# Patient Record
Sex: Female | Born: 1955 | Hispanic: Refuse to answer | Marital: Married | Smoking: Never smoker
Health system: Southern US, Community
[De-identification: ages and names within clinical notes are randomized; demographics above are authoritative.]

## PROBLEM LIST (undated history)

## (undated) DIAGNOSIS — E785 Hyperlipidemia, unspecified: Secondary | ICD-10-CM

## (undated) DIAGNOSIS — R011 Cardiac murmur, unspecified: Secondary | ICD-10-CM

## (undated) DIAGNOSIS — M1990 Unspecified osteoarthritis, unspecified site: Secondary | ICD-10-CM

## (undated) DIAGNOSIS — H269 Unspecified cataract: Secondary | ICD-10-CM

## (undated) DIAGNOSIS — I10 Essential (primary) hypertension: Secondary | ICD-10-CM

## (undated) DIAGNOSIS — I82409 Acute embolism and thrombosis of unspecified deep veins of unspecified lower extremity: Secondary | ICD-10-CM

## (undated) DIAGNOSIS — K5730 Diverticulosis of large intestine without perforation or abscess without bleeding: Secondary | ICD-10-CM

## (undated) DIAGNOSIS — Z8619 Personal history of other infectious and parasitic diseases: Secondary | ICD-10-CM

## (undated) DIAGNOSIS — D649 Anemia, unspecified: Secondary | ICD-10-CM

## (undated) DIAGNOSIS — I341 Nonrheumatic mitral (valve) prolapse: Secondary | ICD-10-CM

## (undated) DIAGNOSIS — N390 Urinary tract infection, site not specified: Secondary | ICD-10-CM

## (undated) DIAGNOSIS — K635 Polyp of colon: Secondary | ICD-10-CM

## (undated) DIAGNOSIS — I499 Cardiac arrhythmia, unspecified: Secondary | ICD-10-CM

## (undated) HISTORY — DX: Anemia, unspecified: D64.9

## (undated) HISTORY — DX: Nonrheumatic mitral (valve) prolapse: I34.1

## (undated) HISTORY — DX: Cardiac murmur, unspecified: R01.1

## (undated) HISTORY — DX: Personal history of other infectious and parasitic diseases: Z86.19

## (undated) HISTORY — DX: Polyp of colon: K63.5

## (undated) HISTORY — PX: OOPHORECTOMY: SHX86

## (undated) HISTORY — DX: Cardiac arrhythmia, unspecified: I49.9

## (undated) HISTORY — DX: Urinary tract infection, site not specified: N39.0

## (undated) HISTORY — DX: Unspecified osteoarthritis, unspecified site: M19.90

## (undated) HISTORY — DX: Essential (primary) hypertension: I10

## (undated) HISTORY — DX: Hyperlipidemia, unspecified: E78.5

## (undated) HISTORY — DX: Acute embolism and thrombosis of unspecified deep veins of unspecified lower extremity: I82.409

## (undated) HISTORY — DX: Diverticulosis of large intestine without perforation or abscess without bleeding: K57.30

## (undated) HISTORY — DX: Unspecified cataract: H26.9

---

## 1974-06-12 HISTORY — PX: FINGER SURGERY: SHX640

## 1977-06-12 HISTORY — PX: TONSILLECTOMY AND ADENOIDECTOMY: SUR1326

## 1984-06-12 HISTORY — PX: PARTIAL HYSTERECTOMY: SHX80

## 1995-06-13 HISTORY — PX: CHOLECYSTECTOMY: SHX55

## 1995-06-13 HISTORY — PX: APPENDECTOMY: SHX54

## 2001-06-12 HISTORY — PX: BLADDER TUMOR EXCISION: SHX238

## 2003-06-13 HISTORY — PX: BACK SURGERY: SHX140

## 2007-06-13 DIAGNOSIS — I82409 Acute embolism and thrombosis of unspecified deep veins of unspecified lower extremity: Secondary | ICD-10-CM

## 2007-06-13 HISTORY — PX: BUNIONECTOMY: SHX129

## 2007-06-13 HISTORY — DX: Acute embolism and thrombosis of unspecified deep veins of unspecified lower extremity: I82.409

## 2010-11-24 ENCOUNTER — Encounter: Payer: Self-pay | Admitting: Family Medicine

## 2010-11-24 ENCOUNTER — Ambulatory Visit (INDEPENDENT_AMBULATORY_CARE_PROVIDER_SITE_OTHER): Payer: BC Managed Care – PPO | Admitting: Family Medicine

## 2010-11-24 DIAGNOSIS — Z8672 Personal history of thrombophlebitis: Secondary | ICD-10-CM

## 2010-11-24 DIAGNOSIS — E785 Hyperlipidemia, unspecified: Secondary | ICD-10-CM

## 2010-11-24 DIAGNOSIS — Z86718 Personal history of other venous thrombosis and embolism: Secondary | ICD-10-CM

## 2010-11-24 DIAGNOSIS — I10 Essential (primary) hypertension: Secondary | ICD-10-CM

## 2010-11-24 DIAGNOSIS — J310 Chronic rhinitis: Secondary | ICD-10-CM

## 2010-11-24 LAB — LIPID PANEL
Total CHOL/HDL Ratio: 4
VLDL: 27.4 mg/dL (ref 0.0–40.0)

## 2010-11-24 LAB — CBC WITH DIFFERENTIAL/PLATELET
Basophils Absolute: 0 10*3/uL (ref 0.0–0.1)
Eosinophils Absolute: 0.2 10*3/uL (ref 0.0–0.7)
Hemoglobin: 12.6 g/dL (ref 12.0–15.0)
Lymphocytes Relative: 55.8 % — ABNORMAL HIGH (ref 12.0–46.0)
MCHC: 33.9 g/dL (ref 30.0–36.0)
Monocytes Relative: 5.7 % (ref 3.0–12.0)
Neutro Abs: 2.3 10*3/uL (ref 1.4–7.7)
Neutrophils Relative %: 35.8 % — ABNORMAL LOW (ref 43.0–77.0)
RBC: 4.21 Mil/uL (ref 3.87–5.11)
RDW: 14.7 % — ABNORMAL HIGH (ref 11.5–14.6)

## 2010-11-24 LAB — HEPATIC FUNCTION PANEL
ALT: 15 U/L (ref 0–35)
AST: 19 U/L (ref 0–37)
Alkaline Phosphatase: 72 U/L (ref 39–117)
Bilirubin, Direct: 0.1 mg/dL (ref 0.0–0.3)
Total Bilirubin: 0.6 mg/dL (ref 0.3–1.2)
Total Protein: 8 g/dL (ref 6.0–8.3)

## 2010-11-24 LAB — LDL CHOLESTEROL, DIRECT: Direct LDL: 172.5 mg/dL

## 2010-11-24 LAB — BASIC METABOLIC PANEL
CO2: 32 mEq/L (ref 19–32)
Chloride: 103 mEq/L (ref 96–112)
Potassium: 4 mEq/L (ref 3.5–5.1)

## 2010-11-24 NOTE — Patient Instructions (Signed)
Please schedule your complete physical at your convenience- you can eat before this appt since we'll have your labs Start OTC Claritin or Zyrtec daily for allergy symptoms Start a program like [employer] or [place] to help w/ weight loss We'll notify you of your lab results and adjust/start meds as needed STOP THE AMLODIPINE (NORVASC) Call with any questions or concerns Welcome!  We're glad to have you!

## 2010-11-24 NOTE — Progress Notes (Signed)
  Subjective:    Patient ID: Traci Vasquez, female    DOB: [birth year], 55 y.o.   MRN: [id number]  HPI Previous MD- Darlen Round, prior to that Dr Shaune Pollack at [facility].  Mammo is due in July, has had colonoscopy w/in 10 yrs.  Urologist- [facility].  HTN- chronic problem for pt, excellent control today.  Started Norvasc 2-3 yrs ago when pt was under a lot of stress.  No CP, SOB, HAs, visual changes, edema  URI- sxs started 1 month ago, has been on 2 different abx- is currently on 2nd med which was a 30 day script.  Cough persists.  Hx of asthma and PNA.  Has albuterol inhaler but this is expired.  Hyperlipidemia- chronic problem for pt.  Is supposed to be on Zocor but 'it makes me feel so bad'.  Reports med made nauseous and 'woozy'.  Has been off meds for over 2 yrs.  Would like to lose weight to improve #s.  Hx of DVT- occurred post op, 9/09.  Completed coumadin course.  Hasn't had any problems since.  Seasonal allergies- chronic problem for pt, not currently on meds.  Previously had allergy shots (25 yrs ago).  Will have difficulty w/ grass, mold/mildew, dust.     Review of Systems For ROS see HPI     Objective:   Physical Exam  Constitutional: She is oriented to person, place, and time. She appears well-developed and well-nourished. No distress.  HENT:  Head: Normocephalic and atraumatic.  Mouth/Throat: Oropharynx is clear and moist. No oropharyngeal exudate.       Marked turbinate edema + PND No TTP over sinuses  Eyes: Conjunctivae and EOM are normal. Pupils are equal, round, and reactive to light.  Neck: Normal range of motion. Neck supple. No thyromegaly present.  Cardiovascular: Normal rate, regular rhythm, normal heart sounds and intact distal pulses.   No murmur heard. Pulmonary/Chest: Effort normal and breath sounds normal. No respiratory distress.  Abdominal: Soft. She exhibits no distension. There is no tenderness.  Musculoskeletal: She exhibits no edema.    Lymphadenopathy:    She has no cervical adenopathy.  Neurological: She is alert and oriented to person, place, and time.  Skin: Skin is warm and dry.  Psychiatric: She has a normal mood and affect. Her behavior is normal.          Assessment & Plan:

## 2010-11-26 LAB — VITAMIN D 1,25 DIHYDROXY
Vitamin D 1, 25 (OH)2 Total: 46 pg/mL (ref 18–72)
Vitamin D2 1, 25 (OH)2: <8 pg/mL
Vitamin D3 1, 25 (OH)2: 46 pg/mL

## 2010-12-01 ENCOUNTER — Encounter: Payer: Self-pay | Admitting: *Deleted

## 2010-12-02 ENCOUNTER — Encounter: Payer: Self-pay | Admitting: *Deleted

## 2010-12-11 ENCOUNTER — Encounter: Payer: Self-pay | Admitting: Family Medicine

## 2010-12-11 NOTE — Assessment & Plan Note (Signed)
Pt's 1 month of URI sxs is currently not infectious at all but rather untreated allergies.  Start oral antihistamine daily.  Reviewed supportive care and red flags that should prompt return.  Pt expressed understanding and is in agreement w/ plan.

## 2010-12-11 NOTE — Assessment & Plan Note (Signed)
Excellent control.  Possibly over medicated.  Will stop Norvasc and monitor closely.

## 2010-12-11 NOTE — Assessment & Plan Note (Signed)
Chronic problem for pt but not currently on meds.  Will check labs.  Discussed importance of healthy diet and regular exercise for both weight loss and health benefits.

## 2010-12-11 NOTE — Assessment & Plan Note (Signed)
This occurred post op.  Completed course of coumadin at the time and has not had any problems since.  Will need to watch if future surgery is needed.

## 2011-01-04 ENCOUNTER — Ambulatory Visit (INDEPENDENT_AMBULATORY_CARE_PROVIDER_SITE_OTHER): Payer: BC Managed Care – PPO | Admitting: Family Medicine

## 2011-01-04 VITALS — BP 150/90 | Temp 98.9°F | Wt 216.6 lb

## 2011-01-04 DIAGNOSIS — R21 Rash and other nonspecific skin eruption: Secondary | ICD-10-CM | POA: Insufficient documentation

## 2011-01-04 MED ORDER — NYSTATIN-TRIAMCINOLONE 100000-0.1 UNIT/GM-% EX OINT: TOPICAL_OINTMENT | Freq: Two times a day (BID) | CUTANEOUS | Status: DC

## 2011-01-04 MED ORDER — METHYLPREDNISOLONE ACETATE 80 MG/ML IJ SUSP
80.0000 mg | Freq: Once | INTRAMUSCULAR | Status: AC
  Administered 2011-01-04: 80 mg via INTRAMUSCULAR

## 2011-01-04 MED ORDER — PREDNISONE 20 MG PO TABS: ORAL_TABLET | ORAL | Status: DC

## 2011-01-04 NOTE — Assessment & Plan Note (Signed)
Rash consistent w/ yeast.  Given severity and presence of excoriations will start steroids for relief.  Also start antifungal cream.  Reviewed supportive care and red flags that should prompt return.  Pt expressed understanding and is in agreement w/ plan.

## 2011-01-04 NOTE — Patient Instructions (Signed)
Follow up as scheduled Start the Prednisone tomorrow Apply the cream twice daily to help soothe and heal Call with any questions or concerns Hang in there!

## 2011-01-04 NOTE — Progress Notes (Signed)
  Subjective:    Patient ID: Traci Vasquez, female    DOB: 1955-09-03, 55 y.o.   MRN: [id number]  HPI Rash- sxs started over the weekend.  Irritation present in groin and under breasts, worse in groin.  Having burning and itching pain.  No fevers, blisters.  'i'm itching like crazy!  i know i should stop but i can't!'.  Hx of similar that usually requires steroid injection   Review of Systems For ROS see HPI     Objective:   Physical Exam  Constitutional: She appears well-developed and well-nourished. She appears distressed (obviously uncomfortable).  Skin: Rash (consistent w/ fungal infxn under breasts bilaterally and in groin creases) noted. There is erythema (also w/ excoriations in groin creases).          Assessment & Plan:

## 2011-01-23 ENCOUNTER — Encounter: Payer: Self-pay | Admitting: Family Medicine

## 2011-01-23 ENCOUNTER — Ambulatory Visit (INDEPENDENT_AMBULATORY_CARE_PROVIDER_SITE_OTHER): Payer: BC Managed Care – PPO | Admitting: Family Medicine

## 2011-01-23 DIAGNOSIS — Z1231 Encounter for screening mammogram for malignant neoplasm of breast: Secondary | ICD-10-CM

## 2011-01-23 DIAGNOSIS — E785 Hyperlipidemia, unspecified: Secondary | ICD-10-CM

## 2011-01-23 DIAGNOSIS — I10 Essential (primary) hypertension: Secondary | ICD-10-CM

## 2011-01-23 DIAGNOSIS — R209 Unspecified disturbances of skin sensation: Secondary | ICD-10-CM

## 2011-01-23 DIAGNOSIS — Z0000 Encounter for general adult medical examination without abnormal findings: Secondary | ICD-10-CM | POA: Insufficient documentation

## 2011-01-23 DIAGNOSIS — R200 Anesthesia of skin: Secondary | ICD-10-CM

## 2011-01-23 DIAGNOSIS — Z780 Asymptomatic menopausal state: Secondary | ICD-10-CM

## 2011-01-23 NOTE — Patient Instructions (Signed)
Schedule a nurse visit in 2-3 days to have TB test read Follow up in 6 months to recheck BP and cholesterol Someone will call you with your mammo/dexa and hand specialist appts We'll notify you of your lab results Try and make healthy food choices and get regular exercise Call with any questions or concerns Hang in there!

## 2011-01-23 NOTE — Progress Notes (Signed)
  Subjective:    Patient ID: Traci Vasquez, female    DOB: [birth year], 55 y.o.   MRN: [id number]  HPI CPE- s/p TAH-BSO, no need for paps or pelvic exams.  Due for mammo- plans to call and schedule.  UTD on colonoscopy- 2006 or 2007 (Done at [facility])  No concerns today.   Review of Systems Patient reports no vision/ hearing changes, adenopathy,fever, weight change,  persistant/recurrent hoarseness , swallowing issues, chest pain, palpitations, edema, persistant/recurrent cough, hemoptysis, dyspnea (rest/exertional/paroxysmal nocturnal), gastrointestinal bleeding (melena, rectal bleeding), abdominal pain, significant heartburn, bowel changes, GU symptoms (dysuria, hematuria, incontinence), Gyn symptoms (abnormal  bleeding, pain),  syncope, focal weakness, memory loss, skin/hair/nail changes, abnormal bruising or bleeding, anxiety, or depression.   + numbness/tingling of R hand- has been present for 15 yrs but recently progressing.  Will 'lock up'    Objective:   Physical Exam  General Appearance:    Alert, cooperative, no distress, appears stated age  Head:    Normocephalic, without obvious abnormality, atraumatic  Eyes:    PERRL, conjunctiva/corneas clear, EOM's intact, fundi    benign, both eyes  Ears:    Normal TM's and external ear canals, both ears  Nose:   Nares normal, septum midline, mucosa normal, no drainage    or sinus tenderness  Throat:   Lips, mucosa, and tongue normal; teeth and gums normal  Neck:   Supple, symmetrical, trachea midline, no adenopathy;    Thyroid: no enlargement/tenderness, small (~1cm) firm nodule superior to thyroid  Back:     Symmetric, no curvature, ROM normal, no CVA tenderness  Lungs:     Clear to auscultation bilaterally, respirations unlabored  Chest Wall:    No tenderness or deformity   Heart:    Regular rate and rhythm, S1 and S2 normal, no murmur, rub   or gallop  Breast Exam:    No tenderness, masses, or nipple abnormality  Abdomen:     Soft,  non-tender, bowel sounds active all four quadrants,    no masses, no organomegaly  Genitalia:    deferred  Rectal:    Extremities:   Extremities normal, atraumatic, no cyanosis or edema  Pulses:   2+ and symmetric all extremities  Skin:   Skin color, texture, turgor normal, no rashes or lesions  Lymph nodes:   Cervical, supraclavicular, and axillary nodes normal  Neurologic:   CNII-XII intact, normal strength, sensation and reflexes    throughout          Assessment & Plan:

## 2011-01-25 LAB — TB SKIN TEST
Induration: 0
TB Skin Test: NEGATIVE mm

## 2011-01-31 NOTE — Assessment & Plan Note (Signed)
Pt's PE WNL.  Due for mammo and dexa.  Will refer.  Check labs.  Anticipatory guidance provided.

## 2011-01-31 NOTE — Assessment & Plan Note (Signed)
At goal.  Tolerating meds.  No changes.

## 2011-01-31 NOTE — Assessment & Plan Note (Signed)
Hx of carpal tunnel.  Was told to return when sxs bothered her.  They are now bothersome.  Refer to hand surgeon for evaluation and tx.

## 2011-01-31 NOTE — Assessment & Plan Note (Signed)
Due for labs.  Adjust meds prn. 

## 2011-02-02 ENCOUNTER — Other Ambulatory Visit: Payer: Self-pay | Admitting: Family Medicine

## 2011-02-02 MED ORDER — HYDROCHLOROTHIAZIDE 25 MG PO TABS: 25.0000 mg | ORAL_TABLET | Freq: Every day | ORAL | Status: DC

## 2011-02-02 NOTE — Telephone Encounter (Signed)
Rx sent 

## 2011-03-16 ENCOUNTER — Encounter: Payer: Self-pay | Admitting: Family Medicine

## 2011-03-21 ENCOUNTER — Encounter: Payer: Self-pay | Admitting: Family Medicine

## 2011-06-20 ENCOUNTER — Ambulatory Visit: Payer: BC Managed Care – PPO | Admitting: Internal Medicine

## 2011-08-15 ENCOUNTER — Ambulatory Visit (INDEPENDENT_AMBULATORY_CARE_PROVIDER_SITE_OTHER): Payer: BC Managed Care – PPO | Admitting: Family Medicine

## 2011-08-15 ENCOUNTER — Encounter: Payer: Self-pay | Admitting: Family Medicine

## 2011-08-15 DIAGNOSIS — I951 Orthostatic hypotension: Secondary | ICD-10-CM

## 2011-08-15 DIAGNOSIS — K529 Noninfective gastroenteritis and colitis, unspecified: Secondary | ICD-10-CM

## 2011-08-15 DIAGNOSIS — K5289 Other specified noninfective gastroenteritis and colitis: Secondary | ICD-10-CM

## 2011-08-15 DIAGNOSIS — R42 Dizziness and giddiness: Secondary | ICD-10-CM

## 2011-08-15 DIAGNOSIS — R21 Rash and other nonspecific skin eruption: Secondary | ICD-10-CM

## 2011-08-15 DIAGNOSIS — R3129 Other microscopic hematuria: Secondary | ICD-10-CM | POA: Insufficient documentation

## 2011-08-15 LAB — POCT URINALYSIS DIPSTICK
Leukocytes, UA: NEGATIVE
Nitrite, UA: NEGATIVE
Protein, UA: NEGATIVE
Urobilinogen, UA: 0.2
pH, UA: 5

## 2011-08-15 MED ORDER — NYSTATIN-TRIAMCINOLONE 100000-0.1 UNIT/GM-% EX OINT: TOPICAL_OINTMENT | Freq: Two times a day (BID) | CUTANEOUS | Status: DC

## 2011-08-15 MED ORDER — CIPROFLOXACIN HCL 500 MG PO TABS: 500.0000 mg | ORAL_TABLET | Freq: Two times a day (BID) | ORAL | Status: AC

## 2011-08-15 NOTE — Assessment & Plan Note (Signed)
New.  Reviewed ER record from [facility].  Pt w/ normal CBC, normal abd CT.  Continue Zofran.  Push fluids.

## 2011-08-15 NOTE — Assessment & Plan Note (Signed)
New.  Pt orthostatic today.  Likely the cause of her positional dizziness.  Encouraged increased fluids, changing positions slowly.  Reviewed supportive care and red flags that should prompt return.  Pt expressed understanding and is in agreement w/ plan.

## 2011-08-15 NOTE — Assessment & Plan Note (Signed)
Refill on cream provided.

## 2011-08-15 NOTE — Patient Instructions (Signed)
You are still recovering from your viral illness DRINK LOTS OF FLUIDS!!! Your dizziness is due to dehydration Start the Cipro for presumed bladder infection Call with any questions or concerns Hang in there!

## 2011-08-15 NOTE — Progress Notes (Signed)
  Subjective:    Patient ID: Traci Vasquez, female    DOB: January 29, 1956, 56 y.o.   MRN: [id number]  HPI ER f/u- went to ER on Sunday w/ abdominal cramping, diarrhea, nausea, HA.  Was given IVF, Zofran.  CBC unremarkable.  Did have abnormal UA w/out cx.  Will need to be repeated.  Still having abd cramping but diarrhea has improved.  Still having 'really bad HA'.  Reports 'stumbling' when getting up.  Will only have 'momentary' dizziness and this only occurs w/ position change.  + nasal congestion.  No ear pain.  Hx of seasonal allergies but not currently on meds.  Nausea has improved.  Able to eat applesauce this AM.   Review of Systems For ROS see HPI     Objective:   Physical Exam  Constitutional: She is oriented to person, place, and time. She appears well-developed and well-nourished. No distress.  HENT:  Head: Normocephalic and atraumatic.       MMM Edematous nasal turbinates bilaterally PND  Eyes: Conjunctivae and EOM are normal. Pupils are equal, round, and reactive to light.  Neck: Normal range of motion. Neck supple. No thyromegaly present.  Cardiovascular: Normal rate, regular rhythm, normal heart sounds and intact distal pulses.   No murmur heard. Pulmonary/Chest: Effort normal and breath sounds normal. No respiratory distress.  Abdominal: Soft. She exhibits no distension. There is no tenderness. There is no rebound and no guarding.  Musculoskeletal: She exhibits no edema.  Lymphadenopathy:    She has no cervical adenopathy.  Neurological: She is alert and oriented to person, place, and time.  Skin: Skin is warm and dry.  Psychiatric: She has a normal mood and affect. Her behavior is normal.          Assessment & Plan:

## 2011-08-15 NOTE — Assessment & Plan Note (Signed)
New.  Pt w/ hematuria in ER, again today.  Asymptomatic- no urinary complaints.  Will start Cipro for presumed UTI.  Reviewed supportive care and red flags that should prompt return.  Pt expressed understanding and is in agreement w/ plan.

## 2011-08-18 ENCOUNTER — Telehealth: Payer: Self-pay

## 2011-08-18 NOTE — Telephone Encounter (Signed)
Left message on home voicemail for patient to return call when available, I also left message on cell for patient to return call. I informed patient this is a non-urgent message.

## 2011-08-18 NOTE — Telephone Encounter (Signed)
Message copied by Maurice Small on Fri Aug 18, 2011  3:37 PM ------      Message from: Sheliah Hatch      Created: Thu Aug 17, 2011  7:58 AM       No UTI- will need repeat UA in 1 month.  If still w/ blood w/ need urology referral.

## 2011-08-18 NOTE — Telephone Encounter (Signed)
Spoke with patient, schedule appointment to recheck urine sample. Patient ok'd all instruction

## 2011-09-13 ENCOUNTER — Other Ambulatory Visit: Payer: BC Managed Care – PPO

## 2011-10-24 ENCOUNTER — Telehealth: Payer: Self-pay | Admitting: Family Medicine

## 2011-10-24 NOTE — Telephone Encounter (Signed)
Pt needs letter stating the date she had her last annual exam. Call [phone] when ready.

## 2011-10-24 NOTE — Telephone Encounter (Signed)
Called pt to advise the letter is ready, pt also noted the need for a TB Skin Test, advised that she had one done on 01-25-11, pt asked for a copy of the test to be placed up front with the letter to be picked up, printed TB skin test results and placed up front for pick up

## 2011-10-27 ENCOUNTER — Ambulatory Visit: Payer: BC Managed Care – PPO | Admitting: Family Medicine

## 2011-11-01 ENCOUNTER — Ambulatory Visit (INDEPENDENT_AMBULATORY_CARE_PROVIDER_SITE_OTHER): Payer: BC Managed Care – PPO | Admitting: Family Medicine

## 2011-11-01 ENCOUNTER — Ambulatory Visit: Payer: BC Managed Care – PPO | Admitting: Family Medicine

## 2011-11-01 ENCOUNTER — Encounter: Payer: Self-pay | Admitting: Family Medicine

## 2011-11-01 VITALS — BP 124/80 | HR 77 | Temp 98.4°F | Ht 64.0 in | Wt 214.5 lb

## 2011-11-01 DIAGNOSIS — M722 Plantar fascial fibromatosis: Secondary | ICD-10-CM

## 2011-11-01 MED ORDER — NAPROXEN 500 MG PO TABS: 500.0000 mg | ORAL_TABLET | Freq: Two times a day (BID) | ORAL | Status: DC

## 2011-11-01 NOTE — Progress Notes (Signed)
  Subjective:    Patient ID: Traci Vasquez, female    DOB: 05/29/1956, 56 y.o.   MRN: [id number]  HPI R foot pain- had surgery in 2009.  For the last week has had pain and swelling.  Swelling doesn't improve w/ elevation.  Dropped a bottle of lotion on it last week and then dropped baby powder.  Having pain at 1st MTP joint and then pain on ball of foot when standing.  Tylenol w/ little improvement.  Pain is most severe 1st thing in the AM or after prolonged sitting   Review of Systems For ROS see HPI     Objective:   Physical Exam  Vitals reviewed. Constitutional: She appears well-developed and well-nourished. No distress.  Musculoskeletal:       Right foot: She exhibits tenderness (over insertion of plantar fascia) and swelling (mild edema over 1st MTP joint at previous surgical site).          Assessment & Plan:

## 2011-11-01 NOTE — Patient Instructions (Signed)
Start the Naproxen twice daily- w/ food Roll your foot over the frozen bottle 2-3x/day Do the stretching multiple times a day Wear good supportive shoes Call if no improvement or w/ questions Hang in there!!!

## 2011-11-01 NOTE — Assessment & Plan Note (Signed)
New.  Reviewed dx and tx w/ pt.  Ice, NSAIDs, stretching.  Reviewed supportive care and red flags that should prompt return.  Pt expressed understanding and is in agreement w/ plan.

## 2011-11-29 ENCOUNTER — Ambulatory Visit (INDEPENDENT_AMBULATORY_CARE_PROVIDER_SITE_OTHER): Payer: BC Managed Care – PPO | Admitting: Family Medicine

## 2011-11-29 ENCOUNTER — Encounter: Payer: Self-pay | Admitting: Family Medicine

## 2011-11-29 ENCOUNTER — Telehealth: Payer: Self-pay | Admitting: *Deleted

## 2011-11-29 VITALS — BP 128/80 | HR 64 | Temp 98.5°F | Ht 64.25 in | Wt 217.5 lb

## 2011-11-29 DIAGNOSIS — G43909 Migraine, unspecified, not intractable, without status migrainosus: Secondary | ICD-10-CM

## 2011-11-29 MED ORDER — ZOLMITRIPTAN 5 MG PO TABS: 5.0000 mg | ORAL_TABLET | ORAL | Status: DC | PRN

## 2011-11-29 MED ORDER — KETOROLAC TROMETHAMINE 60 MG/2ML IM SOLN
60.0000 mg | Freq: Once | INTRAMUSCULAR | Status: AC
  Administered 2011-11-29: 60 mg via INTRAMUSCULAR

## 2011-11-29 MED ORDER — PROMETHAZINE HCL 25 MG PO TABS: 25.0000 mg | ORAL_TABLET | Freq: Three times a day (TID) | ORAL | Status: DC | PRN

## 2011-11-29 NOTE — Telephone Encounter (Signed)
Preferred med are as follows:sumatriptan, naratriptan, relpax, mal alt or mal alt MLT. Marland Kitchen.Please advise if one of these med would be appropriate for the Pt or if not PA will need to be initiated.

## 2011-11-29 NOTE — Telephone Encounter (Signed)
Called pt to clarify a recent call noted from Darl Pikes with [facility] stating the pt was experiancing abdominal pain range 10 out of 10 and diarrhea noted times one, and that she feels terrible, however her headache is now better, pt now advising that she did not take the Zomig given at OV today but noted she did eat a small bowl of grits, 1 slice of bacon and some water, pt then noted her stomach cramping, went to the bathroom and noted a loose stool in which her pain heightened to a 10 out of 10, pt notes last food intake prior today was 1 chicken nugget at 5pm yesterday, pt now noting a pain range of 7 out of 10 per after resting/lying down for a hour or so, spoke with MD Tabori to advise update on pt lowered pain level and was instructed to advise pt to avoid greasy foods, diary and fat, drink plenty of fluids, rest, she can eat toast, saltines applesauce to try and to take immodium with loose stools/diarrhea, use the phenergan for any nausea noted, please also note to go to the ER if her pain worsens, especially 10 out of 10, pt understood

## 2011-11-29 NOTE — Patient Instructions (Addendum)
This is a migraine Go home and take 1 Zomig (this disolves on your tongue).  If no better in 2 hrs, take the 2nd Drink plenty of fluids REST! Use the phenergan as needed Hold onto the 2nd sample box for later use- you also have a script at the pharmacy Call with any questions or concerns- particularly if not improving Hang in there!!!

## 2011-11-29 NOTE — Progress Notes (Signed)
  Subjective:    Patient ID: Traci Vasquez, female    DOB: 1956-04-18, 56 y.o.   MRN: [id number]  HPI HA- sxs started Saturday w/ bitemporal HA.  Had diarrhea Thurs-Mon.  No nausea.  Got IV fluids at [facility] ER for dehydration but did not get migraine tx.  + photo and phonophobia.  + sore throat.  Denies sinus pressure.  Feels similar to previous migraines.     Review of Systems For ROS see HPI     Objective:   Physical Exam  Vitals reviewed. Constitutional: She is oriented to person, place, and time. She appears well-developed and well-nourished. No distress.  HENT:  Head: Normocephalic and atraumatic.       TMs WNL No TTP over sinuses Minimal nasal congestion  Eyes: Conjunctivae and EOM are normal. Pupils are equal, round, and reactive to light.  Neck: Normal range of motion. Neck supple.  Cardiovascular: Normal rate, regular rhythm, normal heart sounds and intact distal pulses.   Pulmonary/Chest: Effort normal and breath sounds normal. No respiratory distress. She has no wheezes. She has no rales.  Lymphadenopathy:    She has no cervical adenopathy.  Neurological: She is alert and oriented to person, place, and time. She has normal reflexes. No cranial nerve deficit. Coordination normal.  Psychiatric: She has a normal mood and affect. Her behavior is normal. Judgment and thought content normal.          Assessment & Plan:

## 2011-11-29 NOTE — Telephone Encounter (Signed)
Ok to switch to Imitrex 50mg .

## 2011-11-29 NOTE — Assessment & Plan Note (Signed)
New.  Pt w/ hx of similar.  No red flags on hx or PE.  toradol given in office.  Script for phenergan sent.  Samples of Zomig given.  Reviewed supportive care and red flags that should prompt return.  Pt expressed understanding and is in agreement w/ plan.

## 2011-11-29 NOTE — Telephone Encounter (Signed)
Agree w/ ER evaluation if pain worsens or doesn't improve.  Pt needs to avoid fatty/greasy food, dairy.  Needs to stick w/ BRAT diet, immodium PRN.

## 2011-12-04 ENCOUNTER — Other Ambulatory Visit: Payer: Self-pay | Admitting: Family Medicine

## 2011-12-04 MED ORDER — PROMETHAZINE HCL 25 MG PO TABS: 25.0000 mg | ORAL_TABLET | Freq: Three times a day (TID) | ORAL | Status: DC | PRN

## 2011-12-04 NOTE — Telephone Encounter (Signed)
refill Promethazine 25mg  tab #20, take one tablet by mouth every eight hours as needed for nausea, last fill 6.19.13, last ov 6.19.13

## 2011-12-04 NOTE — Telephone Encounter (Signed)
rx sent to pharmacy by e-script  

## 2011-12-05 NOTE — Telephone Encounter (Signed)
Please verify med sig and disp #

## 2011-12-05 NOTE — Telephone Encounter (Signed)
Imitrex 50mg  1 tab prn for migraine.  #15, 3 refills

## 2011-12-06 MED ORDER — SUMATRIPTAN SUCCINATE 50 MG PO TABS: ORAL_TABLET | ORAL | Status: DC

## 2011-12-06 NOTE — Telephone Encounter (Signed)
Medication sent with following instructions given verbally by MD Tabori for Imitrex   One tablet PRN may take Second tablet 2 hours after the first one, may not exceed 2 pills in 24HRS   Sent via escribe to pt pharmacy

## 2012-01-05 ENCOUNTER — Ambulatory Visit (INDEPENDENT_AMBULATORY_CARE_PROVIDER_SITE_OTHER): Payer: BC Managed Care – PPO | Admitting: Family Medicine

## 2012-01-05 ENCOUNTER — Encounter: Payer: Self-pay | Admitting: Family Medicine

## 2012-01-05 VITALS — BP 127/82 | HR 80 | Temp 97.3°F | Ht 64.0 in | Wt 214.2 lb

## 2012-01-05 DIAGNOSIS — IMO0002 Reserved for concepts with insufficient information to code with codable children: Secondary | ICD-10-CM

## 2012-01-05 DIAGNOSIS — M5410 Radiculopathy, site unspecified: Secondary | ICD-10-CM | POA: Insufficient documentation

## 2012-01-05 MED ORDER — CYCLOBENZAPRINE HCL 10 MG PO TABS: 10.0000 mg | ORAL_TABLET | Freq: Three times a day (TID) | ORAL | Status: DC | PRN

## 2012-01-05 MED ORDER — NAPROXEN 500 MG PO TABS: 500.0000 mg | ORAL_TABLET | Freq: Two times a day (BID) | ORAL | Status: DC

## 2012-01-05 NOTE — Patient Instructions (Addendum)
This appears to be SI joint inflammation Start the Naproxen twice daily x7-10 days and then as needed.  Take w/ food Use the flexeril for nights/weekend- will make you drowsy Heat your back!! Call with any questions or concerns- especially if things are worsening or not improving Happy Early Birthday! Hang in there!!!

## 2012-01-05 NOTE — Progress Notes (Signed)
  Subjective:    Patient ID: Traci Vasquez, female    DOB: Oct 15, 1955, 56 y.o.   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  Has been on her feet more and doing more activity.  Wed night felt like 'needles were shooting into my L buttock'.  Pain is in low back.  Had itching and numbness of L heel.  No weakness of L leg.  Hx of back injury w/ resulting bulging disc 1999, 2005 had surgery for herniated disc.  No fevers.  No bowel/bladder incontinence.   Review of Systems For ROS see HPI     Objective:   Physical Exam  Vitals reviewed. Constitutional: She is oriented to person, place, and time. She appears well-developed and well-nourished.       Obviously uncomfortable  Musculoskeletal:       Pain w/ extension>flexion + SLR on L, (-) on R + paraspinal lumbar spasm + TTP over L SI joint  Neurological: She is alert and oriented to person, place, and time. She has normal reflexes. Coordination normal.          Assessment & Plan:

## 2012-01-16 NOTE — Assessment & Plan Note (Addendum)
New.  Pt's sxs consistent w/ SI joint pain/inflammation.  Start scheduled NSAIDs, muscle relaxer, heat.  Reviewed supportive care and red flags that should prompt return.  Pt expressed understanding and is in agreement w/ plan.

## 2012-01-24 ENCOUNTER — Encounter: Payer: Self-pay | Admitting: Family Medicine

## 2012-01-24 ENCOUNTER — Ambulatory Visit (INDEPENDENT_AMBULATORY_CARE_PROVIDER_SITE_OTHER): Payer: BC Managed Care – PPO | Admitting: Family Medicine

## 2012-01-24 VITALS — BP 122/79 | HR 60 | Temp 98.1°F | Ht 64.0 in | Wt 211.4 lb

## 2012-01-24 DIAGNOSIS — I10 Essential (primary) hypertension: Secondary | ICD-10-CM

## 2012-01-24 DIAGNOSIS — Z1211 Encounter for screening for malignant neoplasm of colon: Secondary | ICD-10-CM

## 2012-01-24 DIAGNOSIS — Z0000 Encounter for general adult medical examination without abnormal findings: Secondary | ICD-10-CM

## 2012-01-24 DIAGNOSIS — E785 Hyperlipidemia, unspecified: Secondary | ICD-10-CM

## 2012-01-24 LAB — BASIC METABOLIC PANEL
Calcium: 9.7 mg/dL (ref 8.4–10.5)
Chloride: 105 mEq/L (ref 96–112)
Creatinine, Ser: 1 mg/dL (ref 0.4–1.2)
Sodium: 140 mEq/L (ref 135–145)

## 2012-01-24 LAB — LIPID PANEL
Cholesterol: 242 mg/dL — ABNORMAL HIGH (ref 0–200)
Total CHOL/HDL Ratio: 4
Triglycerides: 121 mg/dL (ref 0.0–149.0)
VLDL: 24.2 mg/dL (ref 0.0–40.0)

## 2012-01-24 LAB — CBC WITH DIFFERENTIAL/PLATELET
Basophils Absolute: 0 10*3/uL (ref 0.0–0.1)
Eosinophils Absolute: 0.2 10*3/uL (ref 0.0–0.7)
Hemoglobin: 12.5 g/dL (ref 12.0–15.0)
Lymphocytes Relative: 54.7 % — ABNORMAL HIGH (ref 12.0–46.0)
Lymphs Abs: 3.9 10*3/uL (ref 0.7–4.0)
MCHC: 32.5 g/dL (ref 30.0–36.0)
Neutro Abs: 2.5 10*3/uL (ref 1.4–7.7)
Platelets: 235 10*3/uL (ref 150.0–400.0)
RDW: 14.3 % (ref 11.5–14.6)

## 2012-01-24 LAB — HEPATIC FUNCTION PANEL
Bilirubin, Direct: 0 mg/dL (ref 0.0–0.3)
Total Bilirubin: 0.6 mg/dL (ref 0.3–1.2)

## 2012-01-24 NOTE — Patient Instructions (Addendum)
Follow up in 6 months to recheck BP and cholesterol Call and schedule your mammogram w/ [facility] We'll call you with your GI appt for the colonoscopy Call with any questions or concerns- particularly if this all gets to be too much Hang in there!!!

## 2012-01-24 NOTE — Progress Notes (Signed)
  Subjective:    Patient ID: Traci Vasquez, female    DOB: 1955-11-30, 56 y.o.   MRN: [id number]  HPI CPE- UTD on DEXA, mammo.  Due for colonoscopy.  No concerns today.   Review of Systems Patient reports no vision/ hearing changes, adenopathy,fever, weight change,  persistant/recurrent hoarseness , swallowing issues, chest pain, palpitations, edema, persistant/recurrent cough, hemoptysis, dyspnea (rest/exertional/paroxysmal nocturnal), gastrointestinal bleeding (melena, rectal bleeding), abdominal pain, significant heartburn, bowel changes, GU symptoms (dysuria, hematuria, incontinence), Gyn symptoms (abnormal  bleeding, pain),  syncope, focal weakness, memory loss, numbness & tingling, skin/hair/nail changes, abnormal bruising or bleeding, anxiety, or depression.     Objective:   Physical Exam General Appearance:    Alert, cooperative, no distress, appears stated age  Head:    Normocephalic, without obvious abnormality, atraumatic  Eyes:    PERRL, conjunctiva/corneas clear, EOM's intact, fundi    benign, both eyes  Ears:    Normal TM's and external ear canals, both ears  Nose:   Nares normal, septum midline, mucosa normal, no drainage    or sinus tenderness  Throat:   Lips, mucosa, and tongue normal; teeth and gums normal  Neck:   Supple, symmetrical, trachea midline, no adenopathy;    Thyroid: no enlargement/tenderness/nodules  Back:     Symmetric, no curvature, ROM normal, no CVA tenderness  Lungs:     Clear to auscultation bilaterally, respirations unlabored  Chest Wall:    No tenderness or deformity   Heart:    Regular rate and rhythm, S1 and S2 normal, no murmur, rub   or gallop  Breast Exam:    Deferred at pt's request  Abdomen:     Soft, non-tender, bowel sounds active all four quadrants,    no masses, no organomegaly  Genitalia:    Deferred due to TAH-BSO  Rectal:    Extremities:   Extremities normal, atraumatic, no cyanosis or edema  Pulses:   2+ and symmetric all  extremities  Skin:   Skin color, texture, turgor normal, no rashes or lesions  Lymph nodes:   Cervical, supraclavicular, and axillary nodes normal  Neurologic:   CNII-XII intact, normal strength, sensation and reflexes    throughout          Assessment & Plan:

## 2012-02-04 NOTE — Assessment & Plan Note (Signed)
Pt's PE WNL.  UTD on mammo and DEXA, due for colonoscopy- will refer.  Check labs.  Anticipatory guidance provided.

## 2012-02-04 NOTE — Assessment & Plan Note (Signed)
Chronic problem.  Tolerating statin w/out difficulty.  Check labs.  Adjust meds prn  

## 2012-02-04 NOTE — Assessment & Plan Note (Signed)
Chronic problem.  Adequate control.  Asymptomatic.  Continue meds.

## 2012-02-06 ENCOUNTER — Encounter: Payer: Self-pay | Admitting: Gastroenterology

## 2012-02-14 ENCOUNTER — Telehealth: Payer: Self-pay | Admitting: Family Medicine

## 2012-02-14 MED ORDER — HYDROCHLOROTHIAZIDE 25 MG PO TABS: 25.0000 mg | ORAL_TABLET | Freq: Every day | ORAL | Status: DC

## 2012-02-14 NOTE — Telephone Encounter (Signed)
rx sent to pharmacy by e-script  

## 2012-02-14 NOTE — Telephone Encounter (Signed)
Refill: Hydrochlorothiazide 25mg  tab. Take 1 tablet by mouth every day. Qty 90. Last fill 07-06-11

## 2012-02-26 ENCOUNTER — Encounter: Payer: Self-pay | Admitting: Gastroenterology

## 2012-04-02 ENCOUNTER — Ambulatory Visit (AMBULATORY_SURGERY_CENTER): Payer: BC Managed Care – PPO | Admitting: *Deleted

## 2012-04-02 ENCOUNTER — Telehealth: Payer: Self-pay | Admitting: *Deleted

## 2012-04-02 ENCOUNTER — Encounter: Payer: BC Managed Care – PPO | Admitting: Gastroenterology

## 2012-04-02 VITALS — Ht 64.0 in | Wt 218.0 lb

## 2012-04-02 DIAGNOSIS — Z1211 Encounter for screening for malignant neoplasm of colon: Secondary | ICD-10-CM

## 2012-04-02 MED ORDER — MOVIPREP 100 G PO SOLR: ORAL | Status: DC

## 2012-04-02 NOTE — Progress Notes (Signed)
.    She had her 2nd one at [facility] about 10 years or more ago.  I had her sign a Medical Release and gave it to [employer]. [name] has had 2 colonoscopies one in [facility] in the 90's.  She had her 2nd one at [facility] about 10 years or more ago.  She does not know if she had polyps.   I had her sign a Medical Release and gave it to [employer].  She is scheduled for a colon 04/22/12 with Dr. Jarold Motto.  She has family history of colon polyps in her Mother and Father.  Traci Vasquez

## 2012-04-02 NOTE — Telephone Encounter (Signed)
Traci Vasquez has had 2 colonoscopies one in [facility] in the 90's.  She had her 2nd one at [facility] about 10 years or more ago.  She does not know if she had polyps.   I had her sign a Medical Release and gave it to [employer].  She is scheduled for a colon 04/22/12 with Dr. Jarold Motto.  She has family history of colon polyps in her Mother and Father.  [name]

## 2012-04-03 ENCOUNTER — Encounter: Payer: Self-pay | Admitting: Gastroenterology

## 2012-04-22 ENCOUNTER — Ambulatory Visit (AMBULATORY_SURGERY_CENTER): Payer: BC Managed Care – PPO | Admitting: Gastroenterology

## 2012-04-22 ENCOUNTER — Encounter: Payer: BC Managed Care – PPO | Admitting: Internal Medicine

## 2012-04-22 ENCOUNTER — Encounter: Payer: Self-pay | Admitting: Gastroenterology

## 2012-04-22 VITALS — BP 145/87 | HR 55 | Temp 97.3°F | Resp 24 | Ht 64.0 in | Wt 218.0 lb

## 2012-04-22 DIAGNOSIS — D126 Benign neoplasm of colon, unspecified: Secondary | ICD-10-CM

## 2012-04-22 DIAGNOSIS — Z1211 Encounter for screening for malignant neoplasm of colon: Secondary | ICD-10-CM

## 2012-04-22 DIAGNOSIS — K5730 Diverticulosis of large intestine without perforation or abscess without bleeding: Secondary | ICD-10-CM

## 2012-04-22 MED ORDER — SODIUM CHLORIDE 0.9 % IV SOLN: 500.0000 mL | INTRAVENOUS | Status: DC

## 2012-04-22 NOTE — Progress Notes (Signed)
Patient did not experience any of the following events: a burn prior to discharge; a fall within the facility; wrong site/side/patient/procedure/implant event; or a hospital transfer or hospital admission upon discharge from the facility. (G8907) Patient did not have preoperative order for IV antibiotic SSI prophylaxis. (G8918)  

## 2012-04-22 NOTE — Op Note (Signed)
[facility] [address] [place], [address]   COLONOSCOPY PROCEDURE REPORT  PATIENT: Traci, Vasquez  MR#: [id number] BIRTHDATE: 20-Jun-1955 , 56  yrs. old GENDER: Female ENDOSCOPIST: Mardella Layman, MD, Clementeen Graham REFERRED BY:  Neena Rhymes, M.D. PROCEDURE DATE:  04/22/2012 PROCEDURE:   Colonoscopy with snare polypectomy ASA CLASS:   Class II INDICATIONS:average risk patient for colon cancer. MEDICATIONS: Propofol (Diprivan) 220 mg IV  DESCRIPTION OF PROCEDURE:   After the risks and benefits and of the procedure were explained, informed consent was obtained.  A digital rectal exam revealed no abnormalities of the rectum.    The LB CF-[id number]  endoscope was introduced through the anus and advanced to the cecum, which was identified by both the appendix and ileocecal valve .  The quality of the prep was excellent, using MoviPrep .  The instrument was then slowly withdrawn as the colon was fully examined.     COLON FINDINGS: Moderate diverticulosis was noted in the descending colon and sigmoid colon.   A smooth sessile polyp ranging between 3-67mm in size was found at the cecum.  A polypectomy was performed using snare cautery.  The resection was complete and the polyp tissue was completely retrieved.   The colon mucosa was otherwise normal.     Retroflexed views revealed no abnormalities.     The scope was then withdrawn from the patient and the procedure completed.  COMPLICATIONS: There were no complications. ENDOSCOPIC IMPRESSION: 1.   Moderate diverticulosis was noted in the descending colon and sigmoid colon 2.   Sessile polyp ranging between 3-46mm in size was found at the cecum; polypectomy was performed using snare cautery .Marland KitchenMarland Kitchenprobable serrated adenoma !!! 3.   The colon mucosa was otherwise normal  RECOMMENDATIONS: 1.  Repeat colonoscopy in 5 years if polyp adenomatous; otherwise 10 years 2.  High fiber diet 3.  Hold aspirin, aspirin  products, and anti-inflammatory medication for 2 weeks.   REPEAT EXAM:  cc:  _______________________________ eSignedMardella Layman, MD, [facility] 04/22/2012 9:59 AM     PATIENT NAME:  Traci, Vasquez MR#: [id number]

## 2012-04-22 NOTE — Patient Instructions (Addendum)

## 2012-04-23 ENCOUNTER — Telehealth: Payer: Self-pay | Admitting: *Deleted

## 2012-04-23 NOTE — Telephone Encounter (Signed)
Message left

## 2012-04-26 ENCOUNTER — Encounter: Payer: Self-pay | Admitting: Gastroenterology

## 2012-07-13 ENCOUNTER — Encounter: Payer: Self-pay | Admitting: Family Medicine

## 2012-07-13 ENCOUNTER — Ambulatory Visit (INDEPENDENT_AMBULATORY_CARE_PROVIDER_SITE_OTHER): Payer: BC Managed Care – PPO | Admitting: Family Medicine

## 2012-07-13 VITALS — BP 116/78 | HR 60 | Wt 211.0 lb

## 2012-07-13 DIAGNOSIS — T148XXA Other injury of unspecified body region, initial encounter: Secondary | ICD-10-CM

## 2012-07-13 NOTE — Patient Instructions (Addendum)
-  heat 15 minutes 2 times daily  -ibuprofen as instructed on body as needed for pain  -gentle activities  -take frequent breaks at work and stretch out arm  -follow up with your doctor if worsening or not improving over the next several weeks or other concners

## 2012-07-13 NOTE — Progress Notes (Signed)
Chief Complaint  Patient presents with  . Shoulder Pain    R x 3 days. Aches   . 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  Past Medical History  Diagnosis Date  . Anemia   . Arthritis   . Asthma   . History of chicken pox   . Migraine   . Cardiac arrhythmia   . Heart murmur   . MVP (mitral valve prolapse)   . Hypertension   . Hyperlipidemia   . DVT (deep venous thrombosis) 2009  . Recurrent UTI   . Hematuria   . Cataract     Family History  Problem Relation Age of Onset  . Hyperlipidemia Mother   . Hypertension Mother   . Diabetes Mother   . Colon polyps Mother   . Hypertension Father   . Diabetes Father   . Colon polyps Father   . Kidney disease Paternal Grandmother   . Diabetes Maternal Grandfather     History   Social History  . Marital Status: Married    Spouse Name: N/A    Number of Children: N/A  . Years of Education: N/A   Social History Main Topics  . Smoking status: Never Smoker   . Smokeless tobacco: Never Used  . Alcohol Use: No  . Drug Use: No  . Sexually Active: None   Other Topics Concern  . None   Social History Narrative  . None    Current outpatient prescriptions:BLACK COHOSH PO, Take 1 tablet by mouth as needed. Once or twice a month, Disp: , Rfl: ;  hydrochlorothiazide (HYDRODIURIL) 25 MG tablet, Take 1 tablet (25 mg total) by mouth daily., Disp: 90 tablet, Rfl: 1;  rosuvastatin (CRESTOR) 5 MG tablet, Take 5 mg by mouth daily.  , Disp: , Rfl:  SUMAtriptan (IMITREX) 50 MG tablet, One tablet PRN may take Second tablet 2 hours  after the first one, may not exceed 2 pills in 24HRS, Disp: 15 tablet, Rfl: 3  EXAM:  Filed Vitals:   07/13/12 0937  BP: 116/78  Pulse: 60    There is no height on file to calculate BMI.  GENERAL: vitals reviewed and listed above, alert, oriented, appears well hydrated and in no acute distress  HEENT: atraumatic, conjunttiva clear, no obvious abnormalities on inspection of external nose and ears  NECK: no obvious masses on inspection  LUNGS: clear to auscultation bilaterally, no wheezes, rales or rhonchi, good air movement  CV: HRRR, no peripheral edema  MS: moves all extremities without noticeable abnormality -normal appearance of neck shoulders and arms -TTP at pectoralis M R, RTC insertion on humerous, triceps muscles and Forearm flexors R  -normal ROM, strength in motions of head and neck -normal ROM, strength, sensation and DTRs in UEs bilat -neg spurling, neg empty can, neg bony TTP, neg speeds, neg shawl test, neg impingement test  PSYCH: pleasant and cooperative, no obvious depression or anxiety  ASSESSMENT AND PLAN:  Discussed the following assessment and plan:  1. Muscle strain    -see instructions below -Patient advised to return or notify a  doctor immediately if symptoms worsen or persist or new concerns arise.  Patient Instructions  -heat 15 minutes 2 times daily  -ibuprofen as instructed on body as needed for pain  -gentle activities  -take frequent breaks at work and stretch out arm  -follow up with your doctor if worsening or not improving over the next several weeks or other concners     KIM, HANNAH R.

## 2012-08-10 ENCOUNTER — Other Ambulatory Visit: Payer: Self-pay | Admitting: Family Medicine

## 2012-08-12 ENCOUNTER — Ambulatory Visit (INDEPENDENT_AMBULATORY_CARE_PROVIDER_SITE_OTHER): Payer: BC Managed Care – PPO | Admitting: Family Medicine

## 2012-08-12 ENCOUNTER — Encounter: Payer: Self-pay | Admitting: Family Medicine

## 2012-08-12 VITALS — BP 148/98 | HR 68 | Temp 96.4°F | Wt 213.2 lb

## 2012-08-12 DIAGNOSIS — R51 Headache: Secondary | ICD-10-CM

## 2012-08-12 DIAGNOSIS — I10 Essential (primary) hypertension: Secondary | ICD-10-CM

## 2012-08-12 MED ORDER — MOMETASONE FUROATE 50 MCG/ACT NA SUSP: 2.0000 | Freq: Every day | NASAL | Status: DC

## 2012-08-12 MED ORDER — AMLODIPINE BESYLATE 5 MG PO TABS: 5.0000 mg | ORAL_TABLET | Freq: Every day | ORAL | Status: DC

## 2012-08-12 MED ORDER — TRAMADOL HCL 50 MG PO TABS: 50.0000 mg | ORAL_TABLET | Freq: Three times a day (TID) | ORAL | Status: DC | PRN

## 2012-08-12 MED ORDER — KETOROLAC TROMETHAMINE 60 MG/2ML IM SOLN
60.0000 mg | Freq: Once | INTRAMUSCULAR | Status: AC
  Administered 2012-08-12: 60 mg via INTRAMUSCULAR

## 2012-08-12 NOTE — Patient Instructions (Addendum)
Headache and Allergies  The relationship between allergies and headaches is unclear. Many people with allergic or infectious nasal problems also have headaches (migraines or sinus headaches). However, sometimes allergies can cause pressure that feels like a headache, and sometimes headaches can cause allergy-like symptoms. It is not always clear whether your symptoms are caused by allergies or by a headache.  CAUSES   · Migraine: The cause of a migraine is not always known.  · Sinus Headache: The cause of a sinus headache may be a sinus infection. Other conditions that may be related to sinus headaches include:  · Hay fever (allergic rhinitis).  · Deviation of the nasal septum.  · Swelling or clogging of the nasal passages.  SYMPTOMS   Migraine headache symptoms (which often last 4 to 72 hours) include:  · Intense, throbbing pain on one or both sides of the head.  · Nausea.  · Vomiting.  · Being extra sensitive to light.  · Being extra sensitive to sound.  · Nervous system reactions that appear similar to an allergic reaction:  · Stuffy nose.  · Runny nose.  · Tearing.  Sinus headaches are felt as facial pain or pressure.   DIAGNOSIS   Because there is some overlap in symptoms, sinus and migraine headaches are often misdiagnosed. For example, a person with migraines may also feel facial pressure. Likewise, many people with hay fever may get migraine headaches rather than sinus headaches. These migraines can be triggered by the histamine release during an allergic reaction. An antihistamine medicine can eliminate this pain.  There are standard criteria that help clarify the difference between these headaches and related allergy or allergy-like symptoms. Your caregiver can use these criteria to determine the proper diagnosis and provide you the best care.  TREATMENT   Migraine medicine may help people who have persistent migraine headaches even though their hay fever is controlled. For some people, anti-inflammatory  treatments do not work to relieve migraines. Medicines called triptans (such as sumatriptan) can be helpful for those people.  Document Released: 08/19/2003 Document Revised: 08/21/2011 Document Reviewed: 09/10/2009  ExitCare® Patient Information ©2013 [employer].

## 2012-08-12 NOTE — Progress Notes (Signed)
  Subjective:    Traci Vasquez is a 57 y.o. female who presents for evaluation of headache. Symptoms began about 3 days ago..  The patient rates her most severe headaches a 7 on a scale from 1 to 10. Recently, the headaches have been increasing in frequency. Work attendance or other daily activities are affected by the headaches. Precipitating factors include: cold temperatures and URI symptoms. The headaches are usually not preceded by an aura. Associated neurologic symptoms: decreased physical activity, vomiting in the early morning and worsening school/work performance. The patient denies depression, dizziness, loss of balance, muscle weakness, numbness of extremities, speech difficulties and vision problems. Home treatment has included acetaminophen and ibuprofen, darkening the room and resting with little improvement. Other history includes: recent uri symptoms.  The following portions of the patient's history were reviewed and updated as appropriate: allergies, current medications, past family history, past medical history, past social history, past surgical history and problem list.  Review of Systems Pertinent items are noted in HPI.    Objective:    BP 148/98  Pulse 68  Temp(Src) 96.4 F (35.8 C) (Tympanic)  Wt 213 lb 3.2 oz (96.707 kg)  BMI 36.58 kg/m2  SpO2 96% General appearance: alert, cooperative, appears stated age and no distress Throat: lips, mucosa, and tongue normal; teeth and gums normal Neck: no adenopathy, supple, symmetrical, trachea midline and thyroid not enlarged, symmetric, no tenderness/mass/nodules Lungs: clear to auscultation bilaterally Heart: S1, S2 normal Extremities: extremities normal, atraumatic, no cyanosis or edema    Assessment:    headache with uri symptoms    Plan:    Lie in darkened room and apply cold packs as needed for pain. Episodic therapy: Toradol injections as needed and ultram sent to pharmacy due to low frequency of pain. Side effect  profile discussed in detail. Patient reassured that neurodiagnostic workup not indicated from benign H&P.

## 2012-08-12 NOTE — Assessment & Plan Note (Signed)
Restart norvasc F/u Dr Beverely Low in 2 weeks

## 2012-08-27 ENCOUNTER — Encounter: Payer: Self-pay | Admitting: Family Medicine

## 2012-08-27 ENCOUNTER — Telehealth: Payer: Self-pay | Admitting: Gastroenterology

## 2012-08-27 ENCOUNTER — Encounter: Payer: Self-pay | Admitting: Gastroenterology

## 2012-08-27 ENCOUNTER — Ambulatory Visit (INDEPENDENT_AMBULATORY_CARE_PROVIDER_SITE_OTHER): Payer: BC Managed Care – PPO | Admitting: Family Medicine

## 2012-08-27 VITALS — BP 120/82 | HR 74 | Temp 98.3°F | Wt 212.0 lb

## 2012-08-27 MED ORDER — HYOSCYAMINE SULFATE 0.125 MG SL SUBL: 0.1250 mg | SUBLINGUAL_TABLET | SUBLINGUAL | Status: DC | PRN

## 2012-08-27 MED ORDER — POTASSIUM CHLORIDE CRYS ER 20 MEQ PO TBCR: 20.0000 meq | EXTENDED_RELEASE_TABLET | Freq: Every day | ORAL | Status: DC

## 2012-08-27 NOTE — Patient Instructions (Addendum)

## 2012-08-27 NOTE — Telephone Encounter (Signed)
Pt given an appt with Mike Gip, PA tomorrow.

## 2012-08-27 NOTE — Progress Notes (Signed)
  Subjective:     Traci Vasquez is a 57 y.o. female who presents for evaluation of abdominal pain. Onset was 3 days ago. Symptoms have been gradually worsening. The pain is described as burning, colicky, pressure-like and sharp, and is 8/10 in intensity. Pain is located in the LLQ and RLQ without radiation.  Aggravating factors: none.  Alleviating factors: none. Associated symptoms: none. The patient denies anorexia, arthralagias, belching, chills, constipation, diarrhea, dysuria, fever, flatus, frequency, headache, hematochezia, hematuria, melena, myalgias, nausea, sweats and vomiting.  The patient's history has been marked as reviewed and updated as appropriate.  Review of Systems Pertinent items are noted in HPI.     Objective:    BP 120/82  Pulse 74  Temp(Src) 98.3 F (36.8 C) (Oral)  Wt 212 lb (96.163 kg)  BMI 36.37 kg/m2  SpO2 94% General appearance: alert, cooperative, appears stated age and no distress Abdomen: abnormal findings:  mild tenderness in the RLQ and in the LLQ    Assessment:    Abdominal pain, likely secondary to IBS? Marland Kitchen    Plan:    The diagnosis was discussed with the patient and evaluation and treatment plans outlined. See orders for lab and imaging studies. Adhere to simple, bland diet. Initiate trial of anticholinergic medications for IBS. Follow up as needed. refer to GI

## 2012-08-28 ENCOUNTER — Ambulatory Visit (INDEPENDENT_AMBULATORY_CARE_PROVIDER_SITE_OTHER): Payer: BC Managed Care – PPO | Admitting: Physician Assistant

## 2012-08-28 ENCOUNTER — Ambulatory Visit: Payer: BC Managed Care – PPO | Admitting: Family Medicine

## 2012-08-28 ENCOUNTER — Encounter: Payer: Self-pay | Admitting: Physician Assistant

## 2012-08-28 VITALS — BP 130/80 | HR 60 | Ht 63.5 in | Wt 212.0 lb

## 2012-08-28 DIAGNOSIS — Z860100 Personal history of colon polyps, unspecified: Secondary | ICD-10-CM | POA: Insufficient documentation

## 2012-08-28 DIAGNOSIS — K5730 Diverticulosis of large intestine without perforation or abscess without bleeding: Secondary | ICD-10-CM

## 2012-08-28 DIAGNOSIS — R1032 Left lower quadrant pain: Secondary | ICD-10-CM

## 2012-08-28 MED ORDER — METRONIDAZOLE 500 MG PO TABS: 500.0000 mg | ORAL_TABLET | Freq: Two times a day (BID) | ORAL | Status: AC

## 2012-08-28 MED ORDER — FLUCONAZOLE 150 MG PO TABS: ORAL_TABLET | ORAL | Status: DC

## 2012-08-28 MED ORDER — CIPROFLOXACIN HCL 500 MG PO TABS: 500.0000 mg | ORAL_TABLET | Freq: Two times a day (BID) | ORAL | Status: AC

## 2012-08-28 NOTE — Patient Instructions (Addendum)
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 We sent prescriptions to [facility] for Cipro, Flagyl, and Diflucan for a yeast infection.  You have a follow up appointment with Dr. Jarold Motto on 09-12-2012 at 9 Am.

## 2012-08-28 NOTE — Progress Notes (Signed)
Subjective:    Patient ID: Traci Vasquez, female    DOB: 05-02-1956, 57 y.o.   MRN: [id number]  HPI Traci Vasquez is a pleasant 57 year old Afriacan-American female known to Dr. Jarold Motto from screening colonoscopy done in November of 2013. She was found to have moderate diverticulosis of the descending and sigmoid colon and one sessile polyp which was removed. Path on polyp consistent with a tubular Patient comes in today with complaints of onset of lower abdominal pain on Sunday, 08/25/2012. She says she had fairly abrupt onset of lower abdominal  cramping and sharp stabbing pains. She says the initial episode was brief and and the pain subsided the following morning after she ate breakfast she began having pain again then became progressively worse over the next couple of hours and was fairly constant. She said she was driving to work and had an episode of lightheadedness felt flushed etc. did not have any nausea vomiting or documented fever. She says she did feel like she had chills. 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 Was given IV fluids according to her report and and analgesic and said that she did feel better when she left. However yesterday morning she continued to have lower abdominal tenderness which was not as severe- she describes it more as an aching discomfort in her lower abdomen. Her bowels have been moving without difficulty no melena or hematochezia she does have some increased pain with the bowel movement. She was seen by Dr. Laury Axon  yesterday and given anti-spasmodic and Ultram for pain and asked to schedule appointment here . She has no urinary symptoms. She continues to have dull aching discomfort in her left lower quadrant she says is somewhat worse  with ambulation.    Review of Systems  Constitutional: Negative.   HENT: Negative.   Eyes: Negative.   Respiratory: Negative.   Cardiovascular: Negative.   Gastrointestinal: Positive for abdominal pain.  Endocrine: Negative.   Genitourinary: Negative.   Allergic/Immunologic: Negative.   Neurological: Negative.   Hematological: Negative.   Psychiatric/Behavioral: Negative.    Outpatient Prescriptions Prior to Visit  Medication Sig Dispense Refill  . amLODipine (NORVASC) 5 MG tablet Take 1 tablet (5 mg total) by mouth daily.  90 tablet  3  . BLACK COHOSH PO Take 1 tablet by mouth as needed. Once or twice a month      . hydrochlorothiazide (HYDRODIURIL) 25 MG tablet TAKE 1 TABLET BY MOUTH EVERY DAY  90 tablet  1  . hyoscyamine (LEVSIN/SL) 0.125 MG SL tablet Place 1 tablet (0.125 mg total) under the tongue every 4 (four) hours as needed for cramping.  30 tablet  0  . mometasone (NASONEX) 50 MCG/ACT nasal spray Place 2 sprays into the nose daily.  17 g  12  . potassium chloride SA (K-DUR,KLOR-CON) 20 MEQ tablet Take 1 tablet (20 mEq total) by mouth daily.  30 tablet  0  . rosuvastatin (CRESTOR) 5 MG tablet Take 5 mg by mouth daily.        . traMADol (ULTRAM) 50 MG tablet Take 1 tablet (50 mg total) by mouth every 8 (eight) hours as needed for pain.  20 tablet  0  . SUMAtriptan (IMITREX) 50 MG tablet One tablet PRN may take Second tablet  2 hours after the first one, may not exceed 2 pills in 24HRS  15 tablet  3   No facility-administered medications prior to visit.   Allergies  Allergen Reactions  . Latex     Itch, hives, asthma attack  . Codeine Other (See Comments)    Feels like something is crawling on her head   . Ivp Dye (Iodinated Diagnostic Agents) Rash  . Penicillins Itching, Swelling and Rash   Patient Active Problem List  Diagnosis  . HTN (hypertension)  . Hyperlipidemia  . Rhinitis  . History of DVT of lower extremity  . Rash  . Hand numbness  . Other screening  mammogram  . General medical examination  . Post-menopausal  . Gastroenteritis  . Hematuria, microscopic  . Orthostatic lightheadedness  . Plantar fasciitis  . Migraine headache  . Radicular low back pain  . Diverticulosis of colon without hemorrhage  . Personal history of colonic polyps   History  Substance Use Topics  . Smoking status: Never Smoker   . Smokeless tobacco: Never Used  . Alcohol Use: No   family history includes Colon polyps in her father and mother; Diabetes in her father, maternal grandfather, and mother; Hyperlipidemia in her mother; Hypertension in her father and mother; and Kidney disease in her paternal grandmother.  There is no history of Colon cancer.     Objective:   Physical Exam well-developed African American female in no acute distress, pleasant blood pressure 130/80 pulse 60 height 5 foot 3 weight 212. HEENT; nontraumatic normocephalic EOMI PERRLA sclera anicteric,Neck; Supple no JVD, Cardiovascular; regular rate and rhythm with S1-S2 no murmur or gallop,, Pulmonary ;clear bilaterally, Abdomen ;soft bowel sounds are active she is tender in the left lower quadrant no guarding no rebound no palpable mass or hepatosplenomegaly , Rectal; exam not done, Extremities; no clubbing cyanosis or edema skin warm and dry, Psych; mood and affect normal and appropriate.        Assessment & Plan:  #69  57 year old female with a four-day history of left lower quadrant pain suspect she has diverticulitis that was not appreciated on CT scan without IV contrast. #2 adenomatous colon polyps-just had colonoscopy November 2013 #3 hypertension  Plan; start Cipro 500 mg by mouth twice daily and Flagyl 500 mg by mouth twice daily x10 days both to be taken with food Tylenol as needed for discomfort Bland diet with gradual advancement Patient relates history of vaginal candidiasis with any antibiotics and will therefore be given per prescription for Diflucan 150 mg to take x1 and  onset of symptoms and may repeat x1 Followup with Dr. Jarold Motto or myself in 2 weeks, is advised to call in the interim should her pain worsens or she develop fever diarrhea etc. She is given a note to return to work tomorrow

## 2012-08-30 ENCOUNTER — Other Ambulatory Visit: Payer: Self-pay | Admitting: Family Medicine

## 2012-09-05 ENCOUNTER — Encounter: Payer: Self-pay | Admitting: *Deleted

## 2012-09-12 ENCOUNTER — Ambulatory Visit: Payer: BC Managed Care – PPO | Admitting: Gastroenterology

## 2012-09-21 ENCOUNTER — Emergency Department (HOSPITAL_BASED_OUTPATIENT_CLINIC_OR_DEPARTMENT_OTHER): Payer: BC Managed Care – PPO

## 2012-09-21 ENCOUNTER — Emergency Department (HOSPITAL_BASED_OUTPATIENT_CLINIC_OR_DEPARTMENT_OTHER)
Admission: EM | Admit: 2012-09-21 | Discharge: 2012-09-21 | Disposition: A | Discharge status: Home / Self Care | Payer: BC Managed Care – PPO | Attending: Emergency Medicine | Admitting: Emergency Medicine

## 2012-09-21 ENCOUNTER — Encounter (HOSPITAL_BASED_OUTPATIENT_CLINIC_OR_DEPARTMENT_OTHER): Payer: Self-pay

## 2012-09-21 DIAGNOSIS — Z87440 Personal history of urinary (tract) infections: Secondary | ICD-10-CM | POA: Insufficient documentation

## 2012-09-21 DIAGNOSIS — Z862 Personal history of diseases of the blood and blood-forming organs and certain disorders involving the immune mechanism: Secondary | ICD-10-CM | POA: Insufficient documentation

## 2012-09-21 DIAGNOSIS — Z9889 Other specified postprocedural states: Secondary | ICD-10-CM | POA: Insufficient documentation

## 2012-09-21 DIAGNOSIS — Z8619 Personal history of other infectious and parasitic diseases: Secondary | ICD-10-CM | POA: Insufficient documentation

## 2012-09-21 DIAGNOSIS — Z87448 Personal history of other diseases of urinary system: Secondary | ICD-10-CM | POA: Insufficient documentation

## 2012-09-21 DIAGNOSIS — Z86718 Personal history of other venous thrombosis and embolism: Secondary | ICD-10-CM | POA: Insufficient documentation

## 2012-09-21 DIAGNOSIS — Z8719 Personal history of other diseases of the digestive system: Secondary | ICD-10-CM | POA: Insufficient documentation

## 2012-09-21 DIAGNOSIS — Z8679 Personal history of other diseases of the circulatory system: Secondary | ICD-10-CM | POA: Insufficient documentation

## 2012-09-21 DIAGNOSIS — G43909 Migraine, unspecified, not intractable, without status migrainosus: Secondary | ICD-10-CM | POA: Insufficient documentation

## 2012-09-21 DIAGNOSIS — M109 Gout, unspecified: Secondary | ICD-10-CM | POA: Insufficient documentation

## 2012-09-21 DIAGNOSIS — I10 Essential (primary) hypertension: Secondary | ICD-10-CM | POA: Insufficient documentation

## 2012-09-21 DIAGNOSIS — J45909 Unspecified asthma, uncomplicated: Secondary | ICD-10-CM | POA: Insufficient documentation

## 2012-09-21 DIAGNOSIS — E785 Hyperlipidemia, unspecified: Secondary | ICD-10-CM | POA: Insufficient documentation

## 2012-09-21 DIAGNOSIS — Z8739 Personal history of other diseases of the musculoskeletal system and connective tissue: Secondary | ICD-10-CM | POA: Insufficient documentation

## 2012-09-21 DIAGNOSIS — Z860100 Personal history of colon polyps, unspecified: Secondary | ICD-10-CM | POA: Insufficient documentation

## 2012-09-21 DIAGNOSIS — Z79899 Other long term (current) drug therapy: Secondary | ICD-10-CM | POA: Insufficient documentation

## 2012-09-21 DIAGNOSIS — R011 Cardiac murmur, unspecified: Secondary | ICD-10-CM | POA: Insufficient documentation

## 2012-09-21 MED ORDER — HYDROCODONE-ACETAMINOPHEN 5-325 MG PO TABS: 2.0000 | ORAL_TABLET | ORAL | Status: DC | PRN

## 2012-09-21 MED ORDER — HYDROCODONE-ACETAMINOPHEN 5-325 MG PO TABS
2.0000 | ORAL_TABLET | Freq: Once | ORAL | Status: AC
  Administered 2012-09-21: 2 via ORAL
  Filled 2012-09-21: qty 2

## 2012-09-21 MED ORDER — PREDNISONE 10 MG PO TABS: ORAL_TABLET | ORAL | Status: DC

## 2012-09-21 NOTE — ED Provider Notes (Signed)
Medical screening examination/treatment/procedure(s) were performed by non-physician practitioner and as supervising physician I was immediately available for consultation/collaboration.   Traci Vasquez. Rima Blizzard, MD 09/21/12 1553

## 2012-09-21 NOTE — ED Notes (Signed)
Pt states that she has R foot pain which started yesterday evening  Pt denies injury or trauma to foot.  PMS intact, ambulates with limping gait

## 2012-09-21 NOTE — ED Provider Notes (Signed)
History     CSN: [id number]  Arrival date & time 09/21/12  1104   First MD Initiated Contact with Patient 09/21/12 1212      Chief Complaint  Patient presents with  . Foot Pain    (Consider location/radiation/quality/duration/timing/severity/associated sxs/prior treatment) Patient is a 57 y.o. female presenting with foot injury. The history is provided by the patient. No language interpreter was used.  Foot Injury Location:  Foot Time since incident:  1 day Foot location:  R foot Pain details:    Quality:  Aching   Radiates to:  Does not radiate   Severity:  Moderate   Onset quality:  Sudden   Duration:  1 day   Timing:  Constant   Progression:  Worsening Chronicity:  New Dislocation: no   Foreign body present:  No foreign bodies Tetanus status:  Out of date Relieved by:  Nothing Worsened by:  Bearing weight Ineffective treatments:  NSAIDs   Past Medical History  Diagnosis Date  . Anemia   . Arthritis   . Asthma   . History of chicken pox   . Migraine   . Cardiac arrhythmia   . Heart murmur   . MVP (mitral valve prolapse)   . Hypertension   . Hyperlipidemia   . DVT (deep venous thrombosis) 2009  . Recurrent UTI   . Hematuria   . Cataract   . Diverticulosis of colon (without mention of hemorrhage)   . Colon polyp     TUBULAR ADENOMA    Past Surgical History  Procedure Laterality Date  . Cholecystectomy  1997  . Appendectomy  1997  . Tonsillectomy and adenoidectomy  1979  . Partial hysterectomy  1986  . Oophorectomy  1990 & 1992  . Back surgery  2005    herniated disc  . Finger surgery  1976    due to torn tendon/ligament  . Cesarean section      x2  . Bunionectomy  2009  . 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 Hyperlipidemia Mother   . Hypertension Mother   . Diabetes Mother   . Colon polyps Mother   . Hypertension Father   .  Diabetes Father   . Colon polyps Father   . Kidney disease Paternal Grandmother   . Diabetes Maternal Grandfather   . Colon cancer Neg Hx     History  Substance Use Topics  . Smoking status: Never Smoker   . Smokeless tobacco: Never Used  . Alcohol Use: No    OB History   Grav Para Term Preterm Abortions TAB SAB Ect Mult Living                  Review of Systems  Musculoskeletal: Positive for joint swelling.  All other systems reviewed and are negative.    Allergies  Latex; Codeine; Ivp dye; and Penicillins  Home Medications   Current Outpatient Rx  Name  Route  Sig  Dispense  Refill  . amLODipine (NORVASC) 5 MG tablet   Oral   Take 1 tablet (5 mg total) by mouth daily.   90 tablet   3   . BLACK COHOSH PO   Oral   Take 1 tablet by mouth as needed. Once or twice a month         . fluconazole (DIFLUCAN) 150 MG tablet  Take 1 tab 3 days in a row at the onset of yeast infection.   3 tablet   0   . hydrochlorothiazide (HYDRODIURIL) 25 MG tablet      TAKE 1 TABLET BY MOUTH EVERY DAY   90 tablet   1   . hyoscyamine (LEVSIN SL) 0.125 MG SL tablet      PLACE 1 TABLET (0.125 MG TOTAL) UNDER THE TONGUE EVERY 4 (FOUR) HOURSAS NEEDED FOR CRAMPING.   30 tablet   1   . mometasone (NASONEX) 50 MCG/ACT nasal spray   Nasal   Place 2 sprays into the nose daily.   17 g   12   . potassium chloride SA (K-DUR,KLOR-CON) 20 MEQ tablet   Oral   Take 1 tablet (20 mEq total) by mouth daily.   30 tablet   0   . rosuvastatin (CRESTOR) 5 MG tablet   Oral   Take 5 mg by mouth daily.           . traMADol (ULTRAM) 50 MG tablet   Oral   Take 1 tablet (50 mg total) by mouth every 8 (eight) hours as needed for pain.   20 tablet   0     BP 131/73  Pulse 83  Temp(Src) 98.1 F (36.7 C) (Oral)  Resp 18  Ht 5\' 3"  (1.6 m)  Wt 211 lb (95.709 kg)  BMI 37.39 kg/m2  SpO2 98%  Physical Exam  Nursing note and vitals reviewed. Constitutional: She is oriented to  person, place, and time. She appears well-developed and well-nourished.  HENT:  Head: Normocephalic and atraumatic.  Musculoskeletal: She exhibits tenderness.  Swelling right 1st and 2nd toe,   Tender to touch,  nv and ns intact  Neurological: She is alert and oriented to person, place, and time. She has normal reflexes.  Skin: There is erythema.  Psychiatric: She has a normal mood and affect.    ED Course  Procedures (including critical care time)  Labs Reviewed - No data to display No results found.   No diagnosis found.    MDM  Xray no fracture,   Pt given hydrocodone 2 here for pain.   Pt given rx for hydrocodone and Prednisone.    I suspect swelling and pain are from Gout.    I advised pt to see her Md on Monday for recheck       Elson Areas, [place] 09/21/12 1346

## 2012-09-23 ENCOUNTER — Telehealth: Payer: Self-pay | Admitting: Family Medicine

## 2012-09-23 NOTE — Telephone Encounter (Signed)
Patient states that she went to the ED over the weekend for foot pain and the physician told her to follow up with PCP within the next few days. When should patient be seen? Thanks!

## 2012-09-24 ENCOUNTER — Other Ambulatory Visit: Payer: Self-pay | Admitting: Family Medicine

## 2012-09-24 NOTE — Telephone Encounter (Signed)
LM @ (2:36pm) asking the pt to RTC.//AB/CMA

## 2012-09-24 NOTE — Telephone Encounter (Signed)
Spoke with the pt to see how she was doing and the pt stated that she taking the Prednisone Dose Pak which is helping, she also has the Hydrocodone.  Informed the pt that I was trying to reach her to schedule her an appt to be seen today by Dr. Laury Axon to f/u on her foot pain.  Pt stated that she just got off from work.  Informed the pt we can schedule her an appt while I had her on the phone.  Pt agreed.  Pt stated that she can only come in on Friday or Monday if we are open b/c she will be off from work.  Informed the pt that we are open Friday only half the day, but will be back on Monday.  Pt stated she can come in on Monday.  Pt scheduled an appt.//AB/CMA

## 2012-10-07 ENCOUNTER — Ambulatory Visit: Payer: BC Managed Care – PPO | Admitting: Nurse Practitioner

## 2012-10-07 ENCOUNTER — Ambulatory Visit (INDEPENDENT_AMBULATORY_CARE_PROVIDER_SITE_OTHER): Payer: BC Managed Care – PPO | Admitting: Nurse Practitioner

## 2012-10-07 ENCOUNTER — Encounter: Payer: Self-pay | Admitting: Nurse Practitioner

## 2012-10-07 ENCOUNTER — Telehealth: Payer: Self-pay | Admitting: *Deleted

## 2012-10-07 VITALS — BP 122/80 | HR 78 | Temp 98.1°F | Ht 64.0 in | Wt 209.0 lb

## 2012-10-07 DIAGNOSIS — J040 Acute laryngitis: Secondary | ICD-10-CM

## 2012-10-07 DIAGNOSIS — J209 Acute bronchitis, unspecified: Secondary | ICD-10-CM

## 2012-10-07 MED ORDER — AZITHROMYCIN 250 MG PO TABS: ORAL_TABLET | ORAL | Status: DC

## 2012-10-07 NOTE — Progress Notes (Signed)
  Subjective:    Patient ID: Traci Vasquez, female    DOB: [birth year], 57 y.o.   MRN: [id number]  Sore Throat  This is a new problem. The current episode started in the past 7 days. The problem has been gradually worsening. The pain is worse on the right side. There has been no fever. The pain is mild. Associated symptoms include congestion, coughing, headaches, a hoarse voice, shortness of breath and swollen glands. Pertinent negatives include no abdominal pain, diarrhea or ear pain. She has had no exposure to strep or mono. She has tried nothing for the symptoms.  Cough This is a new problem. The current episode started in the past 7 days. The problem has been gradually worsening. The problem occurs hourly. The cough is productive of sputum. Associated symptoms include headaches, myalgias, postnasal drip, a sore throat and shortness of breath. Pertinent negatives include no chest pain or ear pain. The symptoms are aggravated by other (talking). Treatments tried: halls cough drops. The treatment provided mild relief. There is no history of asthma.      Review of Systems  Constitutional: Positive for fatigue.  HENT: Positive for congestion, sore throat, hoarse voice and postnasal drip. Negative for ear pain.   Respiratory: Positive for cough and shortness of breath.   Cardiovascular: Negative for chest pain.  Gastrointestinal: Negative for nausea, abdominal pain and diarrhea.  Musculoskeletal: Positive for myalgias.  Neurological: Positive for headaches.  Hematological: Positive for adenopathy.  Psychiatric/Behavioral: Positive for sleep disturbance (interrupted by cough).       Objective:   Physical Exam  Vitals reviewed. Constitutional: She is oriented to person, place, and time. She appears well-developed and well-nourished. No distress.  HENT:  Head: Normocephalic and atraumatic. No trismus in the jaw.  Right Ear: External ear normal. A middle ear effusion is present.  Left Ear:  External ear normal. A middle ear effusion is present.  Nose: Nose normal.  Mouth/Throat: Mucous membranes are normal. No oral lesions. Edematous present. Posterior oropharyngeal edema and posterior oropharyngeal erythema present.  Eyes: Conjunctivae are normal.  Neck: Normal range of motion. Neck supple.  Cardiovascular: Normal rate, regular rhythm and normal heart sounds.   Pulmonary/Chest: No respiratory distress. She has no wheezes.  Lymphadenopathy:    She has cervical adenopathy.  Neurological: She is alert and oriented to person, place, and time.  Skin: Skin is warm and dry. No rash noted.  Psychiatric: She has a normal mood and affect. Her behavior is normal. Thought content normal.          Assessment & Plan:  1)laryngitis: voice rest, warm tea, salt water gargle, listerene gargles 2)bronchitis: productive cough & fatigue-rest increase fluids, mucinex as directed. Azithromycin to start in 2-3 days if no improvement.

## 2012-10-07 NOTE — Patient Instructions (Signed)
You likely have a viral illness. Rest, fluids, mucinex as package directs, continue nasonex, voice rest, warm tea. I have prescribed an antibiotic, but would like for you to wait a few days before filling it. Fill it if you have no improvement. Call for re-eval if you have not improved by the end of the week or symptoms worsen. Feel better.

## 2012-10-08 NOTE — Telephone Encounter (Signed)
error 

## 2013-01-03 ENCOUNTER — Ambulatory Visit (INDEPENDENT_AMBULATORY_CARE_PROVIDER_SITE_OTHER): Payer: BC Managed Care – PPO | Admitting: Family Medicine

## 2013-01-03 VITALS — BP 122/68 | HR 64 | Temp 98.0°F | Wt 211.0 lb

## 2013-01-03 DIAGNOSIS — M5410 Radiculopathy, site unspecified: Secondary | ICD-10-CM

## 2013-01-03 DIAGNOSIS — IMO0002 Reserved for concepts with insufficient information to code with codable children: Secondary | ICD-10-CM

## 2013-01-03 MED ORDER — CYCLOBENZAPRINE HCL 10 MG PO TABS: 10.0000 mg | ORAL_TABLET | Freq: Three times a day (TID) | ORAL | Status: DC | PRN

## 2013-01-03 MED ORDER — HYDROCODONE-ACETAMINOPHEN 5-325 MG PO TABS: 2.0000 | ORAL_TABLET | ORAL | Status: DC | PRN

## 2013-01-03 MED ORDER — PREDNISONE 10 MG PO TABS: ORAL_TABLET | ORAL | Status: DC
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
## 2013-01-03 NOTE — Patient Instructions (Addendum)
Schedule your complete physical at your convenience Call me next week and let me know how things are going Start the Prednisone today- take w/ food Use the Vicodin as needed for severe pain Use the flexeril for muscle spasm HEAT! Call with any questions or concerns Hang in there!!

## 2013-01-03 NOTE — Assessment & Plan Note (Signed)
Recurrent issue for pt.  Recommended ortho/neurosurg evaluation but pt prefers to hold off on this for financial reasons.  Start pred taper, muscle relaxer, pain meds.  Heat prn.  If no improvement by next week, will refer to ortho for complete evaluation.  Reviewed supportive care and red flags that should prompt return.  Pt expressed understanding and is in agreement w/ plan.

## 2013-01-20 ENCOUNTER — Telehealth: Payer: Self-pay | Admitting: Family Medicine

## 2013-01-20 DIAGNOSIS — M545 Low back pain: Secondary | ICD-10-CM

## 2013-01-20 NOTE — Telephone Encounter (Signed)
Please advise.//AB/CMA 

## 2013-01-20 NOTE — Telephone Encounter (Signed)
Needs ortho referral for radicular low back pain

## 2013-01-20 NOTE — Telephone Encounter (Signed)
Patient is calling about her back pain. 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 Patient wants to know what else she can do.

## 2013-01-21 NOTE — Telephone Encounter (Signed)
Spoke with patient and advised of recommendations. Agreed with plan. Entered referral for ortho.

## 2013-01-26 ENCOUNTER — Other Ambulatory Visit: Payer: Self-pay | Admitting: Family Medicine

## 2013-01-29 NOTE — Telephone Encounter (Signed)
Last OV 7-25 Med filled 12-24-12 #45 with 0 refill  CPE on 02-21-13

## 2013-01-29 NOTE — Telephone Encounter (Signed)
Med filled.  

## 2013-02-20 ENCOUNTER — Encounter: Payer: Self-pay | Admitting: Lab

## 2013-02-21 ENCOUNTER — Encounter: Payer: BC Managed Care – PPO | Admitting: Family Medicine

## 2013-02-21 ENCOUNTER — Other Ambulatory Visit: Payer: Self-pay | Admitting: Family Medicine

## 2013-02-21 NOTE — Telephone Encounter (Signed)
Med filled.  

## 2013-02-24 ENCOUNTER — Ambulatory Visit (INDEPENDENT_AMBULATORY_CARE_PROVIDER_SITE_OTHER): Payer: BC Managed Care – PPO | Admitting: Nurse Practitioner

## 2013-02-24 ENCOUNTER — Encounter: Payer: Self-pay | Admitting: Nurse Practitioner

## 2013-02-24 VITALS — BP 136/84 | HR 57 | Temp 98.9°F | Ht 64.0 in | Wt 210.2 lb

## 2013-02-24 DIAGNOSIS — M549 Dorsalgia, unspecified: Secondary | ICD-10-CM

## 2013-02-24 DIAGNOSIS — F4321 Adjustment disorder with depressed mood: Secondary | ICD-10-CM

## 2013-02-24 DIAGNOSIS — I10 Essential (primary) hypertension: Secondary | ICD-10-CM

## 2013-02-24 DIAGNOSIS — G8929 Other chronic pain: Secondary | ICD-10-CM

## 2013-02-24 DIAGNOSIS — E785 Hyperlipidemia, unspecified: Secondary | ICD-10-CM

## 2013-02-24 MED ORDER — DULOXETINE HCL 30 MG PO CPEP: 30.0000 mg | ORAL_CAPSULE | Freq: Every day | ORAL | Status: DC

## 2013-02-24 NOTE — Progress Notes (Signed)
Subjective:   Traci Vasquez is an 57 y.o. female who presents for evaluation and treatment of depressive symptoms. She has gone back to work recently with a different job assignment that involves a significant increase in stress. She is having difficulty coping with added stress, given her chronic back pain. She has been seen multiple times for back pain and recently saw an orthopedist. She has an MRI scheduled. She was treated in the past for depression-about 15 years ago. She is not sleeping well due to pain in hips & back.  Current symptoms include depressed mood, anhedonia, insomnia, fatigue,.  Current treatment for depression:None Sleep problems: Moderate   Early awakening:Absent   Energy: Poor Motivation: Fair Concentration: Fair Rumination/worrying: Moderate Memory: Good Tearfulness: Mild  Anxiety: Moderate  Panic: Absent  Overall Mood: Moderately worse  Hopelessness: Absent Suicidal ideation: Absent  Other/Psychosocial Stressors: caregiver for mother, husband lost job last spring, chronic hip & back pain Family history positive for depression in the patient's unknown.  Previous treatment modalities employed include None.  Past episodes of depression:treated for depression about 15 years ago-does not remember what she took Organic causes of depression present: None.  Review of Systems Constitutional: positive for fatigue Neurological: negative for coordination problems, gait problems, paresthesia, seizures, tremors, weakness and long history of migraine headaches Behavioral/Psych: positive for anxiety, depression and sleep disturbance, negative for bad mood, decreased appetite, excessive alcohol consumption and tobacco use Endocrine: negative for diabetic symptoms including polydipsia, polyphagia and polyuria and temperature intolerance   Objective:   Mental Status Examination: Posture and motor behavior: Appropriate Dress, grooming, personal hygiene: Appropriate Facial  expression: Appropriate Speech: Appropriate Mood: Appropriate Coherency and relevance of thought: Appropriate Thought content: Appropriate Perceptions: Appropriate Orientation:Appropriate Attention and concentration: Appropriate Memory: : Appropriate Information: Not examined Vocabulary: Appropriate Abstract reasoning: Not examined Judgment: Not examined    Assessment:   Experiencing the following symptoms of depression most of the day nearly every day for more than two consecutive weeks: depressed mood, change in sleep, loss of energy  Situational depression r/t job & family stress, chronic pain Suicide Risk Assessment:denies suicidal ideation HTN: controlled fairly well, taking norvasc,HCTZ Hyperlipidemia: taking crestor History low TSH, fatigue, no other symptoms   Plan:   Chronic back pain & Situational depression. Will start cymbalta at 30 mg daily for 7 days, then increase to 60 mg daily. Samples given for 60 mg, 30 tabs f/u in 3 weeks. Check TSH. Essential hypertension, benign - Plan: Renal function panel, Hepatic function panel, Hemoglobin A1c, CBC,  Urinalysis, Routine w reflex microscopic, Lipid panel  Other and unspecified hyperlipidemia - Plan:  Lipid panel. Continue crestor

## 2013-02-24 NOTE — Patient Instructions (Signed)
I hope the cymbalta will help with back pain and with current depression & stress related to your job. Fill the prescription and take for 7 days, then you will start the samples given to you today. Please follow up in 3 weeks. Feel better!    Depression You have signs of depression. This is a common problem. It can occur at any age. It is often hard to recognize. People can suffer from depression and still have moments of enjoyment. Depression interferes with your basic ability to function in life. It upsets your relationships, sleep, eating, and work habits. CAUSES  Depression is believed to be caused by an imbalance in brain chemicals. It may be triggered by an unpleasant event. Relationship crises, a death in the family, financial worries, retirement, or other stressors are normal causes of depression. Depression may also start for no known reason. Other factors that may play a part include medical illnesses, some medicines, genetics, and alcohol or drug abuse. SYMPTOMS   Feeling unhappy or worthless.   Long-lasting (chronic) tiredness or worn-out feeling.   Self-destructive thoughts and actions.   Not being able to sleep or sleeping too much.   Eating more than usual or not eating at all.   Headaches or feeling anxious.   Trouble concentrating or making decisions.   Unexplained physical problems and substance abuse.  TREATMENT  Depression usually gets better with treatment. This can include:  Antidepressant medicines. It can take weeks before the proper dose is achieved and benefits are reached.   Talking with a therapist, clergyperson, counselor, or friend. These people can help you gain insight into your problem and regain control of your life.   Eating a good diet.   Getting regular physical exercise, such as walking for 30 minutes every day.   Not abusing alcohol or drugs.  Treating depression often takes 6 months or longer. This length of treatment is needed to keep  symptoms from returning. Call your caregiver and arrange for follow-up care as suggested. SEEK IMMEDIATE MEDICAL CARE IF:   You start to have thoughts of hurting yourself or others.   Call your local emergency services (911 in [place]   Go to your local medical emergency department.   Call the National Suicide Prevention Lifeline: 1-800-273-TALK 305 312 7143).  Document Released: 05/29/2005 Document Revised: 05/18/2011 Document Reviewed: 10/29/2009 [facility] Patient Information 2012 [place], [place].

## 2013-02-25 LAB — URINALYSIS, ROUTINE W REFLEX MICROSCOPIC
Ketones, ur: NEGATIVE
Leukocytes, UA: NEGATIVE
Nitrite: NEGATIVE
Specific Gravity, Urine: 1.025 (ref 1.000–1.030)
pH: 6 (ref 5.0–8.0)

## 2013-02-25 LAB — HEPATIC FUNCTION PANEL
ALT: 13 U/L (ref 0–35)
AST: 17 U/L (ref 0–37)
Albumin: 4 g/dL (ref 3.5–5.2)
Alkaline Phosphatase: 60 U/L (ref 39–117)
Total Protein: 8.2 g/dL (ref 6.0–8.3)

## 2013-02-25 LAB — LIPID PANEL
Total CHOL/HDL Ratio: 5
Triglycerides: 201 mg/dL — ABNORMAL HIGH (ref 0.0–149.0)

## 2013-02-25 LAB — TSH: TSH: 0.32 u[IU]/mL — ABNORMAL LOW (ref 0.35–5.50)

## 2013-02-25 LAB — RENAL FUNCTION PANEL
Albumin: 4 g/dL (ref 3.5–5.2)
BUN: 15 mg/dL (ref 6–23)
CO2: 30 mEq/L (ref 19–32)
Chloride: 102 mEq/L (ref 96–112)

## 2013-02-25 LAB — CBC
MCHC: 33.3 g/dL (ref 30.0–36.0)
MCV: 85.7 fl (ref 78.0–100.0)
Platelets: 264 10*3/uL (ref 150.0–400.0)
RDW: 14 % (ref 11.5–14.6)

## 2013-02-25 LAB — LDL CHOLESTEROL, DIRECT: Direct LDL: 178.9 mg/dL

## 2013-02-26 ENCOUNTER — Telehealth: Payer: Self-pay | Admitting: Nurse Practitioner

## 2013-02-26 NOTE — Telephone Encounter (Signed)
Called pt and LMOVM to inform.  

## 2013-02-26 NOTE — Telephone Encounter (Signed)
Low TSH, unable to add free T4.  HgA1c elevated Continues to have hematuria GFR continues to decline Pt needs f/u appt. W/Dr. Beverely Low

## 2013-02-26 NOTE — Telephone Encounter (Signed)
Pt needs appt w/in 2 weeks to review new dx of diabetes and repeat labs.

## 2013-02-26 NOTE — Telephone Encounter (Signed)
Please advise how soon you would like to see pt.

## 2013-03-03 NOTE — Telephone Encounter (Signed)
Called pt again today. No answer.  

## 2013-03-24 ENCOUNTER — Ambulatory Visit (INDEPENDENT_AMBULATORY_CARE_PROVIDER_SITE_OTHER): Payer: BC Managed Care – PPO | Admitting: Family Medicine

## 2013-03-24 ENCOUNTER — Encounter: Payer: Self-pay | Admitting: Family Medicine

## 2013-03-24 VITALS — BP 128/70 | HR 80 | Temp 98.2°F | Resp 16 | Ht 64.0 in | Wt 212.1 lb

## 2013-03-24 DIAGNOSIS — Z1231 Encounter for screening mammogram for malignant neoplasm of breast: Secondary | ICD-10-CM

## 2013-03-24 DIAGNOSIS — R6889 Other general symptoms and signs: Secondary | ICD-10-CM

## 2013-03-24 DIAGNOSIS — R7989 Other specified abnormal findings of blood chemistry: Secondary | ICD-10-CM | POA: Insufficient documentation

## 2013-03-24 DIAGNOSIS — Z0000 Encounter for general adult medical examination without abnormal findings: Secondary | ICD-10-CM

## 2013-03-24 DIAGNOSIS — E2839 Other primary ovarian failure: Secondary | ICD-10-CM

## 2013-03-24 DIAGNOSIS — E119 Type 2 diabetes mellitus without complications: Secondary | ICD-10-CM | POA: Insufficient documentation

## 2013-03-24 DIAGNOSIS — E785 Hyperlipidemia, unspecified: Secondary | ICD-10-CM

## 2013-03-24 DIAGNOSIS — N39490 Overflow incontinence: Secondary | ICD-10-CM

## 2013-03-24 NOTE — Assessment & Plan Note (Signed)
Found on recent labs.  Repeat TSH, add free T3/T4.

## 2013-03-24 NOTE — Assessment & Plan Note (Signed)
New dx for pt.  A1C is 6.8.  No need to start meds at this time but did discuss need for low carb diet and regular exercise.  Encouraged yearly eye exam.  Will refer for diabetic education.  Pt is currently asymptomatic.  Will follow closely.

## 2013-03-24 NOTE — Progress Notes (Signed)
  Subjective:    Patient ID: Traci Vasquez, female    DOB: March 14, 1956, 57 y.o.   MRN: [id number]  HPI CPE- UTD on colonoscopy, due for mammo and DEXA.  No need for paps.   Review of Systems Patient reports no vision/ hearing changes, adenopathy,fever, weight change,  persistant/recurrent hoarseness , swallowing issues, chest pain, palpitations, edema, persistant/recurrent cough, hemoptysis, dyspnea (rest/exertional/paroxysmal nocturnal), gastrointestinal bleeding (melena, rectal bleeding), abdominal pain, significant heartburn, bowel changes, Gyn symptoms (abnormal  bleeding, pain),  syncope, focal weakness, memory loss, numbness & tingling, skin/hair/nail changes, abnormal bruising or bleeding, anxiety, or depression.  + urinary incontinence    Objective:   Physical Exam General Appearance:    Alert, cooperative, no distress, appears stated age  Head:    Normocephalic, without obvious abnormality, atraumatic  Eyes:    PERRL, conjunctiva/corneas clear, EOM's intact, fundi    benign, both eyes  Ears:    Normal TM's and external ear canals, both ears  Nose:   Nares normal, septum midline, mucosa normal, no drainage    or sinus tenderness  Throat:   Lips, mucosa, and tongue normal; teeth and gums normal  Neck:   Supple, symmetrical, trachea midline, no adenopathy;    Thyroid: no enlargement/tenderness/nodules  Back:     Symmetric, no curvature, ROM normal, no CVA tenderness  Lungs:     Clear to auscultation bilaterally, respirations unlabored  Chest Wall:    No tenderness or deformity   Heart:    Regular rate and rhythm, S1 and S2 normal, no murmur, rub   or gallop  Breast Exam:    Deferred to mammo  Abdomen:     Soft, non-tender, bowel sounds active all four quadrants,    no masses, no organomegaly  Genitalia:    Deferred at pt's request  Rectal:    Extremities:   Extremities normal, atraumatic, no cyanosis or edema  Pulses:   2+ and symmetric all extremities  Skin:   Skin color,  texture, turgor normal, no rashes or lesions  Lymph nodes:   Cervical, supraclavicular, and axillary nodes normal  Neurologic:   CNII-XII intact, normal strength, sensation and reflexes    throughout          Assessment & Plan:

## 2013-03-24 NOTE — Patient Instructions (Signed)
Follow up in 3 months to recheck sugar We'll notify you of your lab results and make any changes if needed RESTART the Crestor We'll call you with your mammo and bone density appt We'll call you with your Diabetes Education appt Limit your carb intake- try and get regular exercise Call with any questions or concerns Hang in there!!!

## 2013-03-24 NOTE — Assessment & Plan Note (Signed)
Overdue.  Order entered.

## 2013-03-24 NOTE — Assessment & Plan Note (Signed)
New.  Pt has hx of benign bladder tumors- this may be cause of her new overflow incontinence.  Refer to urology for evaluation.

## 2013-03-24 NOTE — Assessment & Plan Note (Signed)
Pt's PE WNL.  Pt deferred breast exam to upcoming mammo.  UTD on colonoscopy.  Reviewed recent labs w/ pt.  Anticipatory guidance provided.

## 2013-03-24 NOTE — Assessment & Plan Note (Signed)
Chronic problem.  Lipids are not at goal.  Pt admits to not taking Crestor.  Plans to restart.  Stressed the importance of this to decrease MI and CVA.  Pt expressed understanding and is in agreement w/ plan.

## 2013-03-25 LAB — TSH: TSH: 0.31 u[IU]/mL — ABNORMAL LOW (ref 0.35–5.50)

## 2013-03-25 LAB — T4, FREE: Free T4: 0.8 ng/dL (ref 0.60–1.60)

## 2013-03-25 LAB — T3, FREE: T3, Free: 3.1 pg/mL (ref 2.3–4.2)

## 2013-03-27 ENCOUNTER — Other Ambulatory Visit: Payer: Self-pay | Admitting: Family Medicine

## 2013-03-27 ENCOUNTER — Encounter: Payer: Self-pay | Admitting: General Practice

## 2013-03-28 NOTE — Telephone Encounter (Signed)
Med filled.  

## 2013-03-31 LAB — HM DEXA SCAN: HM Dexa Scan: NORMAL

## 2013-04-08 ENCOUNTER — Encounter: Payer: Self-pay | Admitting: Family Medicine

## 2013-04-29 ENCOUNTER — Ambulatory Visit (HOSPITAL_BASED_OUTPATIENT_CLINIC_OR_DEPARTMENT_OTHER)
Admission: RE | Admit: 2013-04-29 | Discharge: 2013-04-29 | Disposition: A | Discharge status: Home / Self Care | Payer: BC Managed Care – PPO | Source: Ambulatory Visit | Attending: Family Medicine | Admitting: Family Medicine

## 2013-04-29 ENCOUNTER — Encounter: Payer: Self-pay | Admitting: General Practice

## 2013-04-29 ENCOUNTER — Encounter: Payer: Self-pay | Admitting: Family Medicine

## 2013-04-29 ENCOUNTER — Ambulatory Visit: Payer: BC Managed Care – PPO | Admitting: Family Medicine

## 2013-04-29 ENCOUNTER — Ambulatory Visit (INDEPENDENT_AMBULATORY_CARE_PROVIDER_SITE_OTHER): Payer: Self-pay | Admitting: Family Medicine

## 2013-04-29 VITALS — BP 116/74 | HR 83 | Temp 97.5°F | Wt 210.0 lb

## 2013-04-29 DIAGNOSIS — M79671 Pain in right foot: Secondary | ICD-10-CM

## 2013-04-29 DIAGNOSIS — M19079 Primary osteoarthritis, unspecified ankle and foot: Secondary | ICD-10-CM | POA: Insufficient documentation

## 2013-04-29 DIAGNOSIS — R51 Headache: Secondary | ICD-10-CM

## 2013-04-29 DIAGNOSIS — M25579 Pain in unspecified ankle and joints of unspecified foot: Secondary | ICD-10-CM | POA: Insufficient documentation

## 2013-04-29 DIAGNOSIS — J45901 Unspecified asthma with (acute) exacerbation: Secondary | ICD-10-CM

## 2013-04-29 DIAGNOSIS — M79609 Pain in unspecified limb: Secondary | ICD-10-CM

## 2013-04-29 DIAGNOSIS — M25571 Pain in right ankle and joints of right foot: Secondary | ICD-10-CM

## 2013-04-29 MED ORDER — BECLOMETHASONE DIPROPIONATE 40 MCG/ACT IN AERS: 2.0000 | INHALATION_SPRAY | Freq: Two times a day (BID) | RESPIRATORY_TRACT | Status: DC

## 2013-04-29 MED ORDER — ALBUTEROL SULFATE HFA 108 (90 BASE) MCG/ACT IN AERS: 2.0000 | INHALATION_SPRAY | Freq: Four times a day (QID) | RESPIRATORY_TRACT | Status: DC | PRN

## 2013-04-29 MED ORDER — TRAMADOL HCL 50 MG PO TABS: 50.0000 mg | ORAL_TABLET | Freq: Three times a day (TID) | ORAL | Status: DC | PRN

## 2013-04-29 NOTE — Patient Instructions (Signed)
Ankle Pain  Ankle pain is a common symptom. The bones, cartilage, tendons, and muscles of the ankle joint perform a lot of work each day. The ankle joint holds your body weight and allows you to move around. Ankle pain can occur on either side or back of 1 or both ankles. Ankle pain may be sharp and burning or dull and aching. There may be tenderness, stiffness, redness, or warmth around the ankle. The pain occurs more often when a person walks or puts pressure on the ankle.  CAUSES   There are many reasons ankle pain can develop. It is important to work with your caregiver to identify the cause since many conditions can impact the bones, cartilage, muscles, and tendons. Causes for ankle pain include:  · Injury, including a break (fracture), sprain, or strain often due to a fall, sports, or a high-impact activity.  · Swelling (inflammation) of a tendon (tendonitis).  · Achilles tendon rupture.  · Ankle instability after repeated sprains and strains.  · Poor foot alignment.  · Pressure on a nerve (tarsal tunnel syndrome).  · Arthritis in the ankle or the lining of the ankle.  · Crystal formation in the ankle (gout or pseudogout).  DIAGNOSIS   A diagnosis is based on your medical history, your symptoms, results of your physical exam, and results of diagnostic tests. Diagnostic tests may include X-ray exams or a computerized magnetic scan (magnetic resonance imaging, MRI).  TREATMENT   Treatment will depend on the cause of your ankle pain and may include:  · Keeping pressure off the ankle and limiting activities.  · Using crutches or other walking support (a cane or brace).  · Using rest, ice, compression, and elevation.  · Participating in physical therapy or home exercises.  · Wearing shoe inserts or special shoes.  · Losing weight.  · Taking medications to reduce pain or swelling or receiving an injection.  · Undergoing surgery.  HOME CARE INSTRUCTIONS   · Only take over-the-counter or prescription medicines for  pain, discomfort, or fever as directed by your caregiver.  · Put ice on the injured area.  · Put ice in a plastic bag.  · Place a towel between your skin and the bag.  · Leave the ice on for 15-20 minutes at a time, 03-04 times a day.  · Keep your leg raised (elevated) when possible to lessen swelling.  · Avoid activities that cause ankle pain.  · Follow specific exercises as directed by your caregiver.  · Record how often you have ankle pain, the location of the pain, and what it feels like. This information may be helpful to you and your caregiver.  · Ask your caregiver about returning to work or sports and whether you should drive.  · Follow up with your caregiver for further examination, therapy, or testing as directed.  SEEK MEDICAL CARE IF:   · Pain or swelling continues or worsens beyond 1 week.  · You have an oral temperature above 102° F (38.9° C).  · You are feeling unwell or have chills.  · You are having an increasingly difficult time with walking.  · You have loss of sensation or other new symptoms.  · You have questions or concerns.  MAKE SURE YOU:   · Understand these instructions.  · Will watch your condition.  · Will get help right away if you are not doing well or get worse.  Document Released: 11/16/2009 Document Revised: 08/21/2011 Document Reviewed: 11/16/2009  [employer]®   Patient Information ©2014 [employer].

## 2013-04-29 NOTE — Progress Notes (Signed)
  Subjective:     Traci Vasquez is a 57 y.o. female here for evaluation of a cough. Onset of symptoms was 1 week ago. Symptoms have been gradually worsening since that time. The cough is dry and nonproductive and is aggravated by cold air, pollens and reclining position. Associated symptoms include: shortness of breath and wheezing. Patient does have a history of asthma. Patient does have a history of environmental allergens. Patient has not traveled recently. Patient does not have a history of smoking. Patient has not had a previous chest x-ray. Patient has not had a PPD done.  The following portions of the patient's history were reviewed and updated as appropriate: allergies, current medications, past family history, past medical history, past social history, past surgical history and problem list.  Review of Systems Pertinent items are noted in HPI.    Objective:    Oxygen saturation 97% on room air BP 116/74  Pulse 83  Temp(Src) 97.5 F (36.4 C) (Oral)  Wt 210 lb (95.255 kg)  SpO2 97% General appearance: alert, cooperative, appears stated age and no distress Ears: normal TM's and external ear canals both ears Nose: Nares normal. Septum midline. Mucosa normal. No drainage or sinus tenderness. Throat: lips, mucosa, and tongue normal; teeth and gums normal Neck: no adenopathy, supple, symmetrical, trachea midline and thyroid not enlarged, symmetric, no tenderness/mass/nodules Lungs: wheezes bilaterally Heart: S1, S2 normal Extremities: edema r ankle medially ---tenderness 1st metatarsal and some errythema,  FROM    Assessment:    Asthma    Plan:    Explained lack of efficacy of antibiotics in viral disease. Avoid exposure to tobacco smoke and fumes. B-agonist inhaler. Call if shortness of breath worsens, blood in sputum, change in character of cough, development of fever or chills, inability to maintain nutrition and hydration. Avoid exposure to tobacco smoke and fumes. Steroid  inhaler as ordered.

## 2013-04-29 NOTE — Progress Notes (Signed)
Pre visit review using our clinic review tool, if applicable. No additional management support is needed unless otherwise documented below in the visit note. 

## 2013-04-29 NOTE — Assessment & Plan Note (Addendum)
Check xray Crutches offered--pt has them at home Check labs Pt has some ultram at home as well

## 2013-05-22 ENCOUNTER — Other Ambulatory Visit: Payer: Self-pay | Admitting: Family Medicine

## 2013-05-23 NOTE — Telephone Encounter (Signed)
Med filled.  

## 2013-06-17 ENCOUNTER — Ambulatory Visit: Payer: BC Managed Care – PPO | Admitting: *Deleted

## 2013-06-24 ENCOUNTER — Ambulatory Visit: Payer: BC Managed Care – PPO | Admitting: Family Medicine

## 2013-07-11 ENCOUNTER — Encounter: Payer: Self-pay | Admitting: Family Medicine

## 2013-09-17 ENCOUNTER — Ambulatory Visit: Payer: 59 | Admitting: Family Medicine

## 2013-09-18 ENCOUNTER — Encounter: Payer: Self-pay | Admitting: Family Medicine

## 2013-09-18 ENCOUNTER — Ambulatory Visit (INDEPENDENT_AMBULATORY_CARE_PROVIDER_SITE_OTHER): Payer: Self-pay | Admitting: Family Medicine

## 2013-09-18 VITALS — BP 122/78 | HR 66 | Temp 98.2°F | Resp 16 | Wt 206.1 lb

## 2013-09-18 DIAGNOSIS — G43909 Migraine, unspecified, not intractable, without status migrainosus: Secondary | ICD-10-CM

## 2013-09-18 DIAGNOSIS — E785 Hyperlipidemia, unspecified: Secondary | ICD-10-CM

## 2013-09-18 DIAGNOSIS — I10 Essential (primary) hypertension: Secondary | ICD-10-CM

## 2013-09-18 MED ORDER — LOVASTATIN 20 MG PO TABS: 20.0000 mg | ORAL_TABLET | Freq: Every day | ORAL | Status: DC

## 2013-09-18 MED ORDER — LISINOPRIL-HYDROCHLOROTHIAZIDE 20-12.5 MG PO TABS: 1.0000 | ORAL_TABLET | Freq: Every day | ORAL | Status: DC

## 2013-09-18 NOTE — Progress Notes (Signed)
   Subjective:    Patient ID: Traci Vasquez, female    DOB: 09-29-1955, 58 y.o.   MRN: [id number]  Headache    HAs- pt reports increased stress due to inability to afford meds.  HAs occuring behind R eye.  Will have intermittent shooting eye pain- no visual changes.  + nausea w/ HAs.  Taking Excedrin migraine w/ some relief.  Asymptomatic today.  HTN- chronic problem, adequate control on current meds but unable to afford.  Denies CP, SOB, + HAs, no visual changes, edema.  Hyperlipidemia- not taking cholesterol meds, willing to try Lovastatin at [facility].   Review of Systems  Neurological: Positive for headaches.   For ROS see HPI     Objective:   Physical Exam  Vitals reviewed. Constitutional: She is oriented to person, place, and time. She appears well-developed and well-nourished. No distress.  HENT:  Head: Normocephalic and atraumatic.  Eyes: Conjunctivae and EOM are normal. Pupils are equal, round, and reactive to light.  Neck: Normal range of motion. Neck supple. No thyromegaly present.  Cardiovascular: Normal rate, regular rhythm, normal heart sounds and intact distal pulses.   No murmur heard. Pulmonary/Chest: Effort normal and breath sounds normal. No respiratory distress.  Abdominal: Soft. She exhibits no distension. There is no tenderness.  Musculoskeletal: She exhibits no edema.  Lymphadenopathy:    She has no cervical adenopathy.  Neurological: She is alert and oriented to person, place, and time.  Skin: Skin is warm and dry.  Psychiatric: She has a normal mood and affect. Her behavior is normal.          Assessment & Plan:

## 2013-09-18 NOTE — Assessment & Plan Note (Signed)
Chronic problem.  Well controlled today.  Has not been taking BP meds regularly due to cost.  Will switch to $4 Lisinopril HCTZ at [facility].  Stressed need to return for followup to check BMP.  Will follow.

## 2013-09-18 NOTE — Assessment & Plan Note (Signed)
Chronic problem, will switch to $4 Lovastatin at [facility].  Will monitor.

## 2013-09-18 NOTE — Assessment & Plan Note (Signed)
Deteriorated due to stress.  Taking Excedrin migraine w/ some control of symptoms.  Encouraged her to restart BP meds and find a stress outlet.  Will follow.

## 2013-09-18 NOTE — Progress Notes (Signed)
Pre visit review using our clinic review tool, if applicable. No additional management support is needed unless otherwise documented below in the visit note. 

## 2013-09-18 NOTE — Patient Instructions (Signed)
If possible, follow up in 4-6 weeks to recheck BP and labs (b/c we're starting a new medication) Start the Lisinopril HCTZ combo pill for the blood pressure Start the Lovastatin for the cholesterol Continue the Excedrin Migraine as needed for headache Try and find a stress outlet! Call with any questions or concerns Hang in there!!!

## 2013-09-19 ENCOUNTER — Telehealth: Payer: Self-pay | Admitting: Family Medicine

## 2013-09-19 NOTE — Telephone Encounter (Signed)
Relevant patient education assigned to patient using Emmi. ° °

## 2013-10-22 ENCOUNTER — Telehealth: Payer: Self-pay | Admitting: Family Medicine

## 2013-10-22 MED ORDER — LISINOPRIL-HYDROCHLOROTHIAZIDE 20-12.5 MG PO TABS: 1.0000 | ORAL_TABLET | Freq: Every day | ORAL | Status: DC

## 2013-10-22 NOTE — Telephone Encounter (Signed)
Med filled.  

## 2013-10-22 NOTE — Telephone Encounter (Signed)
Patient called stating she needs 90 day supply w with 3 refills of lisinopril-hctz. [facility] in [place]

## 2013-11-10 ENCOUNTER — Ambulatory Visit (INDEPENDENT_AMBULATORY_CARE_PROVIDER_SITE_OTHER): Payer: BC Managed Care – PPO | Admitting: Physician Assistant

## 2013-11-10 ENCOUNTER — Encounter: Payer: Self-pay | Admitting: Physician Assistant

## 2013-11-10 ENCOUNTER — Telehealth: Payer: Self-pay | Admitting: Family Medicine

## 2013-11-10 VITALS — BP 128/79 | HR 83 | Temp 98.4°F | Resp 16 | Ht 64.0 in | Wt 209.1 lb

## 2013-11-10 DIAGNOSIS — I10 Essential (primary) hypertension: Secondary | ICD-10-CM

## 2013-11-10 MED ORDER — HYDROCHLOROTHIAZIDE 25 MG PO TABS: 12.5000 mg | ORAL_TABLET | Freq: Every day | ORAL | Status: DC

## 2013-11-10 MED ORDER — AMLODIPINE BESYLATE 5 MG PO TABS: 5.0000 mg | ORAL_TABLET | Freq: Every day | ORAL | Status: DC

## 2013-11-10 NOTE — Progress Notes (Signed)
Pre visit review using our clinic review tool, if applicable. No additional management support is needed unless otherwise documented below in the visit note/SLS  

## 2013-11-10 NOTE — Assessment & Plan Note (Signed)
Cough likely side effect of ACEI.  Patient has recently gotten her insurance re-established.  Will switch back to Norco and HCTZ.  Monitor for symptoms improvement.  Follow-up with PCP in 3-4 weeks for BP check and reassessment.

## 2013-11-10 NOTE — Progress Notes (Signed)
Patient presents to clinic today c/o persistent dry cough since starting lisinopril in April. Denies nasal congestion, chest congestion, SOB or pleuritic chest pain. Denies acid reflux symptoms.  Endorses some occasional mild allergy symptoms for which she uses an OTC antihistamine.  Past Medical History  Diagnosis Date  . Anemia   . Arthritis   . Asthma   . History of chicken pox   . Migraine   . Cardiac arrhythmia   . Heart murmur   . MVP (mitral valve prolapse)   . Hypertension   . Hyperlipidemia   . DVT (deep venous thrombosis) 2009  . Recurrent UTI   . Hematuria   . Cataract   . Diverticulosis of colon (without mention of hemorrhage)   . Colon polyp     TUBULAR ADENOMA    Current Outpatient Prescriptions on File Prior to Visit  Medication Sig Dispense Refill  . beclomethasone (QVAR) 40 MCG/ACT inhaler Inhale 2 puffs into the lungs 2 (two) times daily.  1 Inhaler  12  . HYDROcodone-acetaminophen (NORCO/VICODIN) 5-325 MG per tablet Take 2 tablets by mouth every 4 (four) hours as needed for pain.  30 tablet  0  . hyoscyamine (LEVSIN SL) 0.125 MG SL tablet PLACE 1 TABLET (0.125 MG TOTAL) UNDER THE TONGUE EVERY 4 (FOUR) HOURSAS NEEDED FOR CRAMPING.  30 tablet  1  . lovastatin (MEVACOR) 20 MG tablet Take 1 tablet (20 mg total) by mouth at bedtime.  30 tablet  3  . mometasone (NASONEX) 50 MCG/ACT nasal spray Place 2 sprays into the nose daily.  17 g  12  . nystatin-triamcinolone ointment (MYCOLOG) APPLY TO AFFECTED AREA 2 TIMES DAILY  60 g  2  . rosuvastatin (CRESTOR) 5 MG tablet Take 5 mg by mouth daily.        . traMADol (ULTRAM) 50 MG tablet Take 1 tablet (50 mg total) by mouth every 8 (eight) hours as needed.  20 tablet  0   No current facility-administered medications on file prior to visit.    Allergies  Allergen Reactions  . Latex     Other reaction(s): Unknown Itch, hives, asthma attack  . Codeine Other (See Comments)    Feels like something is crawling on her head    . Ivp Dye [Iodinated Diagnostic Agents] Rash    Uncoded Allergy. Allergen: ivp dye  08/26/2012:Pt states about 30 yrs ago she had MRI with contrast and had a full body rash.  . Penicillins Itching, Swelling and Rash  . Shellfish Allergy Rash    Pt reports rash over whole body    Family History  Problem Relation Age of Onset  . Hyperlipidemia Mother   . Hypertension Mother   . Diabetes Mother   . Colon polyps Mother   . Hypertension Father   . Diabetes Father   . Colon polyps Father   . Kidney disease Paternal Grandmother   . Diabetes Maternal Grandfather   . Colon cancer Neg Hx     History   Social History  . Marital Status: Married    Spouse Name: Iona Beard    Number of Children: 3  . Years of Education: N/A   Occupational History  . Teacher    Social History Main Topics  . Smoking status: Never Smoker   . Smokeless tobacco: Never Used  . Alcohol Use: No  . Drug Use: No  . Sexual Activity: None   Other Topics Concern  . None   Social History Narrative  . None  Review of Systems - See HPI.  All other ROS are negative.  BP 128/79  Pulse 83  Temp(Src) 98.4 F (36.9 C) (Oral)  Resp 16  Ht 5\' 4"  (1.626 m)  Wt 209 lb 2 oz (94.858 kg)  BMI 35.88 kg/m2  SpO2 97%  Physical Exam  Vitals reviewed. Constitutional: She is oriented to person, place, and time and well-developed, well-nourished, and in no distress.  HENT:  Head: Normocephalic and atraumatic.  Right Ear: External ear normal.  Left Ear: External ear normal.  Nose: Nose normal.  Mouth/Throat: Oropharynx is clear and moist. No oropharyngeal exudate.  TM within normal limits bilaterally.  Eyes: Conjunctivae are normal. Pupils are equal, round, and reactive to light.  Neck: Neck supple.  Cardiovascular: Normal rate, regular rhythm, normal heart sounds and intact distal pulses.   Pulmonary/Chest: Effort normal and breath sounds normal. No respiratory distress. She has no wheezes. She has no rales. She  exhibits no tenderness.  Lymphadenopathy:    She has no cervical adenopathy.  Neurological: She is alert and oriented to person, place, and time.  Skin: Skin is warm and dry. No rash noted.  Psychiatric: Affect normal.   Assessment/Plan: HTN (hypertension) Cough likely side effect of ACEI.  Patient has recently gotten her insurance re-established.  Will switch back to Norco and HCTZ.  Monitor for symptoms improvement.  Follow-up with PCP in 3-4 weeks for BP check and reassessment.

## 2013-11-10 NOTE — Patient Instructions (Signed)
STOP Lisinopril as it is likely causing your symptoms.  Restart Norvasc, taking 1 tablet daily.  Also take your HCTZ -- take 1/2 tablet daily.  Follow-up with Dr. Birdie Riddle in 3-4 weeks for BP recheck after we have altered your medications. Call or return to clinic sooner if symptoms persist despite discontinuation of Lisinopril.

## 2013-11-10 NOTE — Telephone Encounter (Signed)
Relevant patient education assigned to patient using Emmi. ° °

## 2013-11-14 ENCOUNTER — Telehealth: Payer: Self-pay | Admitting: Family Medicine

## 2013-11-14 NOTE — Telephone Encounter (Signed)
Patient states that she is still coughing a lot and coughing up green/yellow mucus. Patient made a follow up apt on Monday with Elyn Aquas. Please advise.

## 2013-11-17 ENCOUNTER — Encounter: Payer: Self-pay | Admitting: Physician Assistant

## 2013-11-17 ENCOUNTER — Ambulatory Visit (INDEPENDENT_AMBULATORY_CARE_PROVIDER_SITE_OTHER): Payer: BC Managed Care – PPO | Admitting: Physician Assistant

## 2013-11-17 VITALS — BP 144/80 | HR 69 | Temp 98.5°F | Resp 16 | Ht 64.0 in | Wt 206.2 lb

## 2013-11-17 DIAGNOSIS — J209 Acute bronchitis, unspecified: Secondary | ICD-10-CM | POA: Insufficient documentation

## 2013-11-17 MED ORDER — AZITHROMYCIN 250 MG PO TABS: ORAL_TABLET | ORAL | Status: DC

## 2013-11-17 MED ORDER — BENZONATATE 100 MG PO CAPS: 100.0000 mg | ORAL_CAPSULE | Freq: Two times a day (BID) | ORAL | Status: DC | PRN

## 2013-11-17 NOTE — Progress Notes (Signed)
Patient presents to clinic today c/o productive cough, sinus drainage, ear fullness.  Patient endorses fever at home that has resolved.  Patient denies SOB or pleuritic chest pain.  Patient was seen last week for 1-2 months of persistent, dry cough thought to have started after she was switched to lisinopril for her hypertension.  Patient states that this cough is different in nature.  Past Medical History  Diagnosis Date  . Anemia   . Arthritis   . Asthma   . History of chicken pox   . Migraine   . Cardiac arrhythmia   . Heart murmur   . MVP (mitral valve prolapse)   . Hypertension   . Hyperlipidemia   . DVT (deep venous thrombosis) 2009  . Recurrent UTI   . Hematuria   . Cataract   . Diverticulosis of colon (without mention of hemorrhage)   . Colon polyp     TUBULAR ADENOMA    Current Outpatient Prescriptions on File Prior to Visit  Medication Sig Dispense Refill  . amLODipine (NORVASC) 5 MG tablet Take 1 tablet (5 mg total) by mouth daily.  30 tablet  1  . beclomethasone (QVAR) 40 MCG/ACT inhaler Inhale 2 puffs into the lungs 2 (two) times daily.  1 Inhaler  12  . hydrochlorothiazide (HYDRODIURIL) 25 MG tablet Take 0.5 tablets (12.5 mg total) by mouth daily.  90 tablet  1  . HYDROcodone-acetaminophen (NORCO/VICODIN) 5-325 MG per tablet Take 2 tablets by mouth every 4 (four) hours as needed for pain.  30 tablet  0  . hyoscyamine (LEVSIN SL) 0.125 MG SL tablet PLACE 1 TABLET (0.125 MG TOTAL) UNDER THE TONGUE EVERY 4 (FOUR) HOURSAS NEEDED FOR CRAMPING.  30 tablet  1  . lovastatin (MEVACOR) 20 MG tablet Take 1 tablet (20 mg total) by mouth at bedtime.  30 tablet  3  . mometasone (NASONEX) 50 MCG/ACT nasal spray Place 2 sprays into the nose daily.  17 g  12  . nystatin-triamcinolone ointment (MYCOLOG) APPLY TO AFFECTED AREA 2 TIMES DAILY  60 g  2  . rosuvastatin (CRESTOR) 5 MG tablet Take 5 mg by mouth daily.        . traMADol (ULTRAM) 50 MG tablet Take 1 tablet (50 mg total) by mouth  every 8 (eight) hours as needed.  20 tablet  0   No current facility-administered medications on file prior to visit.    Allergies  Allergen Reactions  . Latex     Other reaction(s): Unknown Itch, hives, asthma attack  . Codeine Other (See Comments)    Feels like something is crawling on her head   . Ivp Dye [Iodinated Diagnostic Agents] Rash    Uncoded Allergy. Allergen: ivp dye  08/26/2012:Pt states about 30 yrs ago she had MRI with contrast and had a full body rash.  . Penicillins Itching, Swelling and Rash  . Shellfish Allergy Rash    Pt reports rash over whole body    Family History  Problem Relation Age of Onset  . Hyperlipidemia Mother   . Hypertension Mother   . Diabetes Mother   . Colon polyps Mother   . Hypertension Father   . Diabetes Father   . Colon polyps Father   . Kidney disease Paternal Grandmother   . Diabetes Maternal Grandfather   . Colon cancer Neg Hx     History   Social History  . Marital Status: Married    Spouse Name: Iona Beard    Number of Children: 3  .  Years of Education: N/A   Occupational History  . Teacher    Social History Main Topics  . Smoking status: Never Smoker   . Smokeless tobacco: Never Used  . Alcohol Use: No  . Drug Use: No  . Sexual Activity: None   Other Topics Concern  . None   Social History Narrative  . None   Review of Systems - See HPI.  All other ROS are negative.  BP 144/80  Pulse 69  Temp(Src) 98.5 F (36.9 C) (Oral)  Resp 16  Ht 5\' 4"  (1.626 m)  Wt 206 lb 4 oz (93.554 kg)  BMI 35.39 kg/m2  SpO2 98%  Physical Exam  Vitals reviewed. Constitutional: She is oriented to person, place, and time and well-developed, well-nourished, and in no distress.  HENT:  Head: Normocephalic and atraumatic.  Right Ear: External ear normal.  Left Ear: External ear normal.  Nose: Nose normal.  Mouth/Throat: Oropharynx is clear and moist. No oropharyngeal exudate.  TM within normal limits bilaterally. No TTP of  sinuses noted on exam.  Eyes: Conjunctivae are normal. Pupils are equal, round, and reactive to light.  Neck: Neck supple.  Cardiovascular: Normal rate, regular rhythm, normal heart sounds and intact distal pulses.   Pulmonary/Chest: Effort normal and breath sounds normal. No respiratory distress. She has no wheezes. She has no rales. She exhibits no tenderness.  Lymphadenopathy:    She has no cervical adenopathy.  Neurological: She is alert and oriented to person, place, and time.  Skin: Skin is warm and dry. No rash noted.  Psychiatric: Affect normal.   Assessment/Plan: Acute bronchitis Rx Azithromycin.  Rx Tessalon Perles for cough.  Plain Mucinex.  Humidifier in bedroom.  Increase fluid intake.  Rest.  Saline nasal spray.

## 2013-11-17 NOTE — Patient Instructions (Signed)
Take antibiotic as directed.  Use Tessalon Perles for cough.  Increase fluids.  Rest.  Use saline nasal spray.  Use Plain Mucinex.  Place a humidifier in the bedroom.  Call or return to clinic if symptoms are not improving.  Acute Bronchitis Bronchitis is inflammation of the airways that extend from the windpipe into the lungs (bronchi). The inflammation often causes mucus to develop. This leads to a cough, which is the most common symptom of bronchitis.  In acute bronchitis, the condition usually develops suddenly and goes away over time, usually in a couple weeks. Smoking, allergies, and asthma can make bronchitis worse. Repeated episodes of bronchitis may cause further lung problems.  CAUSES Acute bronchitis is most often caused by the same virus that causes a cold. The virus can spread from person to person (contagious).  SIGNS AND SYMPTOMS   Cough.   Fever.   Coughing up mucus.   Body aches.   Chest congestion.   Chills.   Shortness of breath.   Sore throat.  DIAGNOSIS  Acute bronchitis is usually diagnosed through a physical exam. Tests, such as chest X-rays, are sometimes done to rule out other conditions.  TREATMENT  Acute bronchitis usually goes away in a couple weeks. Often times, no medical treatment is necessary. Medicines are sometimes given for relief of fever or cough. Antibiotics are usually not needed but may be prescribed in certain situations. In some cases, an inhaler may be recommended to help reduce shortness of breath and control the cough. A cool mist vaporizer may also be used to help thin bronchial secretions and make it easier to clear the chest.  HOME CARE INSTRUCTIONS  Get plenty of rest.   Drink enough fluids to keep your urine clear or pale yellow (unless you have a medical condition that requires fluid restriction). Increasing fluids may help thin your secretions and will prevent dehydration.   Only take over-the-counter or prescription  medicines as directed by your health care provider.   Avoid smoking and secondhand smoke. Exposure to cigarette smoke or irritating chemicals will make bronchitis worse. If you are a smoker, consider using nicotine gum or skin patches to help control withdrawal symptoms. Quitting smoking will help your lungs heal faster.   Reduce the chances of another bout of acute bronchitis by washing your hands frequently, avoiding people with cold symptoms, and trying not to touch your hands to your mouth, nose, or eyes.   Follow up with your health care provider as directed.  SEEK MEDICAL CARE IF: Your symptoms do not improve after 1 week of treatment.  SEEK IMMEDIATE MEDICAL CARE IF:  You develop an increased fever or chills.   You have chest pain.   You have severe shortness of breath.  You have bloody sputum.   You develop dehydration.  You develop fainting.  You develop repeated vomiting.  You develop a severe headache. MAKE SURE YOU:   Understand these instructions.  Will watch your condition.  Will get help right away if you are not doing well or get worse. Document Released: 07/06/2004 Document Revised: 01/29/2013 Document Reviewed: 11/19/2012 [facility] Patient Information 2014 [employer].

## 2013-11-17 NOTE — Progress Notes (Signed)
Pre visit review using our clinic review tool, if applicable. No additional management support is needed unless otherwise documented below in the visit note/SLS  

## 2013-11-17 NOTE — Assessment & Plan Note (Signed)
Rx Azithromycin.  Rx Tessalon Perles for cough.  Plain Mucinex.  Humidifier in bedroom.  Increase fluid intake.  Rest.  Saline nasal spray.

## 2013-11-26 ENCOUNTER — Ambulatory Visit: Payer: BC Managed Care – PPO | Admitting: Nurse Practitioner

## 2013-12-30 ENCOUNTER — Ambulatory Visit: Payer: BC Managed Care – PPO | Admitting: Family Medicine

## 2014-01-14 ENCOUNTER — Ambulatory Visit: Payer: BC Managed Care – PPO | Admitting: Family Medicine

## 2014-01-19 ENCOUNTER — Telehealth: Payer: Self-pay | Admitting: Family Medicine

## 2014-01-19 ENCOUNTER — Encounter: Payer: Self-pay | Admitting: Family Medicine

## 2014-01-19 ENCOUNTER — Ambulatory Visit (INDEPENDENT_AMBULATORY_CARE_PROVIDER_SITE_OTHER): Payer: 59 | Admitting: Family Medicine

## 2014-01-19 VITALS — BP 110/78 | HR 67 | Temp 97.8°F | Ht 62.75 in | Wt 204.4 lb

## 2014-01-19 DIAGNOSIS — J45901 Unspecified asthma with (acute) exacerbation: Secondary | ICD-10-CM

## 2014-01-19 DIAGNOSIS — Z0000 Encounter for general adult medical examination without abnormal findings: Secondary | ICD-10-CM

## 2014-01-19 DIAGNOSIS — G5600 Carpal tunnel syndrome, unspecified upper limb: Secondary | ICD-10-CM

## 2014-01-19 DIAGNOSIS — I10 Essential (primary) hypertension: Secondary | ICD-10-CM

## 2014-01-19 DIAGNOSIS — J4541 Moderate persistent asthma with (acute) exacerbation: Secondary | ICD-10-CM

## 2014-01-19 DIAGNOSIS — G5601 Carpal tunnel syndrome, right upper limb: Secondary | ICD-10-CM

## 2014-01-19 DIAGNOSIS — R51 Headache: Secondary | ICD-10-CM

## 2014-01-19 DIAGNOSIS — E119 Type 2 diabetes mellitus without complications: Secondary | ICD-10-CM

## 2014-01-19 MED ORDER — MOMETASONE FUROATE 50 MCG/ACT NA SUSP: 2.0000 | Freq: Every day | NASAL | Status: AC

## 2014-01-19 MED ORDER — BECLOMETHASONE DIPROPIONATE 40 MCG/ACT IN AERS: 2.0000 | INHALATION_SPRAY | Freq: Two times a day (BID) | RESPIRATORY_TRACT | Status: AC

## 2014-01-19 MED ORDER — ROSUVASTATIN CALCIUM 5 MG PO TABS: 5.0000 mg | ORAL_TABLET | Freq: Every day | ORAL | Status: AC

## 2014-01-19 MED ORDER — HYDROCHLOROTHIAZIDE 25 MG PO TABS
12.5000 mg | ORAL_TABLET | Freq: Every day | ORAL | Status: DC
Start: 2014-01-19 — End: 2014-09-14

## 2014-01-19 MED ORDER — AMLODIPINE BESYLATE 5 MG PO TABS: 5.0000 mg | ORAL_TABLET | Freq: Every day | ORAL | Status: DC

## 2014-01-19 MED ORDER — NYSTATIN-TRIAMCINOLONE 100000-0.1 UNIT/GM-% EX OINT: TOPICAL_OINTMENT | CUTANEOUS | Status: AC

## 2014-01-19 NOTE — Telephone Encounter (Signed)
Per protocol, pt indicated she no longer wants to be a pt here so we will initiate the dismissal process.  I can certainly understand frustration with the insurance system but it is not acceptable to be rude to staff.

## 2014-01-19 NOTE — Assessment & Plan Note (Signed)
Chronic problem.  Pt admits to being non-compliant w/ f/u and tx plan.  Pt to schedule eye exam.  Check labs.  Adjust meds prn

## 2014-01-19 NOTE — Patient Instructions (Signed)
Follow up in 3-4 months to recheck diabetes Please schedule your eye exam Schedule your mammo when you get your reminder notice We'll notify you of your lab results and make any changes if needed We'll call you with your hand appt Call with any questions or concerns Hang in there!! [employer] [birth year] [employer]!

## 2014-01-19 NOTE — Assessment & Plan Note (Signed)
Chronic problem.  Adequate control.  Asymptomatic.  Refills provided.

## 2014-01-19 NOTE — Assessment & Plan Note (Signed)
Pt's PE WNL w/ exception of obesity.  UTD on mammo, DEXA, colonoscopy.  Check labs.  Anticipatory guidance provided.

## 2014-01-19 NOTE — Assessment & Plan Note (Signed)
New.  Refer to hand specialist as pt has had sxs since at least Jan and these are worsening.  Pt expressed understanding and is in agreement w/ plan.

## 2014-01-19 NOTE — Progress Notes (Signed)
Pre visit review using our clinic review tool, if applicable. No additional management support is needed unless otherwise documented below in the visit note. 

## 2014-01-19 NOTE — Telephone Encounter (Signed)
Message copied by Midge Minium on Mon Jan 19, 2014  5:00 PM ------      Message from: Cammie Mcgee T      Created: Mon Jan 19, 2014  4:48 PM       Patient came into office today without insurance card.  Pt stated that HR told her that it would be ok, that she would be in the system.  Front staff tried to inform pt that until the insurance was collected, she would be considered a self pay.  Pt stated that we were [employer], we should have that info in our system.  Staff advised pt that we did not have that info until it was given to [place] by patient for verification.  Patient preceded to turn around and walk away.  Pt was called back up to try and explain the situation again.  Pt just turned around and walked away half way thru the conversation.  After the patients visit, the patient came back up front.  Staff was trying to explain the self pay status again, but was cut off mid sentence by patient who stated she needed to go back to the labs and walk away again.  I spoke with patient a final time and tried explain.  Pt stated that he co-worker did not have to go thru this and said whatever.  I explained to patient that in the future I would appreciate if she showed my staff more respect, especially since she was a [employer] employee.  She said she was being respectful by turning her back and walking away.  Pt then stated that she did not want to be a patient here anymore.  I told patient ok and that I would inform Dr. Birdie Riddle.   ------

## 2014-01-19 NOTE — Progress Notes (Signed)
   Subjective:    Patient ID: Traci Vasquez, female    DOB: September 19, 1955, 58 y.o.   MRN: [id number]  HPI CPE- UTD on mammo, DEXA, colonoscopy.  No need for paps due to hysterectomy.  No concerns today.   Review of Systems Patient reports no hearing changes, adenopathy,fever, weight change,  persistant/recurrent hoarseness , swallowing issues, chest pain, palpitations, edema, persistant/recurrent cough, hemoptysis, dyspnea (rest/exertional/paroxysmal nocturnal), gastrointestinal bleeding (melena, rectal bleeding), abdominal pain, significant heartburn, bowel changes, GU symptoms (dysuria, hematuria, incontinence), Gyn symptoms (abnormal  bleeding, pain),  syncope, focal weakness, memory loss, numbness & tingling, skin/hair/nail changes, abnormal bruising or bleeding, anxiety, or depression.  + blurry vision- pt plans to schedule eye exam. + R carpal tunnel pain    Objective:   Physical Exam General Appearance:    Alert, cooperative, no distress, appears stated age  Head:    Normocephalic, without obvious abnormality, atraumatic  Eyes:    PERRL, conjunctiva/corneas clear, EOM's intact, fundi    benign, both eyes  Ears:    Normal TM's and external ear canals, both ears  Nose:   Nares normal, septum midline, mucosa normal, no drainage    or sinus tenderness  Throat:   Lips, mucosa, and tongue normal; teeth and gums normal  Neck:   Supple, symmetrical, trachea midline, no adenopathy;    Thyroid: no enlargement/tenderness/nodules  Back:     Symmetric, no curvature, ROM normal, no CVA tenderness  Lungs:     Clear to auscultation bilaterally, respirations unlabored  Chest Wall:    No tenderness or deformity   Heart:    Regular rate and rhythm, S1 and S2 normal, no murmur, rub   or gallop  Breast Exam:    Deferred to mammo  Abdomen:     Soft, non-tender, bowel sounds active all four quadrants,    no masses, no organomegaly  Genitalia:    Deferred at pt's request  Rectal:    Extremities:    Extremities normal, atraumatic, no cyanosis or edema  Pulses:   2+ and symmetric all extremities  Skin:   Skin color, texture, turgor normal, no rashes or lesions  Lymph nodes:   Cervical, supraclavicular, and axillary nodes normal  Neurologic:   CNII-XII intact, normal strength, sensation and reflexes    throughout          Assessment & Plan:

## 2014-01-20 LAB — HEMOGLOBIN A1C: HEMOGLOBIN A1C: 6.5 % (ref 4.6–6.5)

## 2014-01-20 LAB — CBC WITH DIFFERENTIAL/PLATELET
BASOS PCT: 0.2 % (ref 0.0–3.0)
Basophils Absolute: 0 10*3/uL (ref 0.0–0.1)
EOS ABS: 0.2 10*3/uL (ref 0.0–0.7)
Eosinophils Relative: 2.7 % (ref 0.0–5.0)
HCT: 38.5 % (ref 36.0–46.0)
Hemoglobin: 12.5 g/dL (ref 12.0–15.0)
LYMPHS PCT: 54.5 % — AB (ref 12.0–46.0)
Lymphs Abs: 4.9 10*3/uL — ABNORMAL HIGH (ref 0.7–4.0)
MCHC: 32.3 g/dL (ref 30.0–36.0)
MCV: 88.6 fl (ref 78.0–100.0)
Monocytes Absolute: 0.3 10*3/uL (ref 0.1–1.0)
Monocytes Relative: 3.6 % (ref 3.0–12.0)
Neutro Abs: 3.5 10*3/uL (ref 1.4–7.7)
Neutrophils Relative %: 39 % — ABNORMAL LOW (ref 43.0–77.0)
Platelets: 255 10*3/uL (ref 150.0–400.0)
RBC: 4.35 Mil/uL (ref 3.87–5.11)
RDW: 14.6 % (ref 11.5–15.5)
WBC: 9 10*3/uL (ref 4.0–10.5)

## 2014-01-20 LAB — BASIC METABOLIC PANEL
BUN: 15 mg/dL (ref 6–23)
CALCIUM: 10 mg/dL (ref 8.4–10.5)
CO2: 23 meq/L (ref 19–32)
CREATININE: 1 mg/dL (ref 0.4–1.2)
Chloride: 98 mEq/L (ref 96–112)
GFR: 58.75 mL/min — ABNORMAL LOW (ref >60.00)
Glucose, Bld: 81 mg/dL (ref 70–99)
Potassium: 3.6 mEq/L (ref 3.5–5.1)
Sodium: 135 mEq/L (ref 135–145)

## 2014-01-20 LAB — TSH: TSH: 0.09 u[IU]/mL — ABNORMAL LOW (ref 0.35–4.50)

## 2014-01-20 LAB — HEPATIC FUNCTION PANEL
ALT: 14 U/L (ref 0–35)
AST: 21 U/L (ref 0–37)
Albumin: 4.3 g/dL (ref 3.5–5.2)
Alkaline Phosphatase: 66 U/L (ref 39–117)
Bilirubin, Direct: 0 mg/dL (ref 0.0–0.3)
Total Bilirubin: 0.7 mg/dL (ref 0.2–1.2)
Total Protein: 9 g/dL — ABNORMAL HIGH (ref 6.0–8.3)

## 2014-01-20 LAB — LIPID PANEL
Cholesterol: 277 mg/dL — ABNORMAL HIGH (ref 0–200)
HDL: 63.7 mg/dL (ref >39.00)
LDL Cholesterol: 176 mg/dL — ABNORMAL HIGH (ref 0–99)
NonHDL: 213.3
Total CHOL/HDL Ratio: 4
Triglycerides: 186 mg/dL — ABNORMAL HIGH (ref 0.0–149.0)
VLDL: 37.2 mg/dL (ref 0.0–40.0)

## 2014-01-20 LAB — VITAMIN D 25 HYDROXY (VIT D DEFICIENCY, FRACTURES): VITD: 21.64 ng/mL — ABNORMAL LOW (ref 30.00–100.00)

## 2014-01-21 ENCOUNTER — Encounter: Payer: Self-pay | Admitting: General Practice

## 2014-01-21 ENCOUNTER — Ambulatory Visit: Payer: 59

## 2014-01-21 DIAGNOSIS — R7989 Other specified abnormal findings of blood chemistry: Secondary | ICD-10-CM

## 2014-01-21 LAB — T4, FREE: FREE T4: 1.04 ng/dL (ref 0.60–1.60)

## 2014-01-21 LAB — T3, FREE: T3, Free: 3.3 pg/mL (ref 2.3–4.2)

## 2014-01-21 MED ORDER — VITAMIN D (ERGOCALCIFEROL) 1.25 MG (50000 UNIT) PO CAPS: 50000.0000 [IU] | ORAL_CAPSULE | ORAL | Status: DC

## 2014-01-25 ENCOUNTER — Encounter (HOSPITAL_BASED_OUTPATIENT_CLINIC_OR_DEPARTMENT_OTHER): Payer: Self-pay | Admitting: Emergency Medicine

## 2014-01-25 ENCOUNTER — Emergency Department (HOSPITAL_BASED_OUTPATIENT_CLINIC_OR_DEPARTMENT_OTHER)
Admission: EM | Admit: 2014-01-25 | Discharge: 2014-01-25 | Disposition: A | Discharge status: Home / Self Care | Payer: 59 | Attending: Emergency Medicine | Admitting: Emergency Medicine

## 2014-01-25 ENCOUNTER — Emergency Department (HOSPITAL_BASED_OUTPATIENT_CLINIC_OR_DEPARTMENT_OTHER): Payer: 59

## 2014-01-25 DIAGNOSIS — Z8739 Personal history of other diseases of the musculoskeletal system and connective tissue: Secondary | ICD-10-CM | POA: Diagnosis not present

## 2014-01-25 DIAGNOSIS — Z91040 Latex allergy status: Secondary | ICD-10-CM | POA: Insufficient documentation

## 2014-01-25 DIAGNOSIS — S99919A Unspecified injury of unspecified ankle, initial encounter: Secondary | ICD-10-CM | POA: Diagnosis present

## 2014-01-25 DIAGNOSIS — J45909 Unspecified asthma, uncomplicated: Secondary | ICD-10-CM | POA: Insufficient documentation

## 2014-01-25 DIAGNOSIS — Z86718 Personal history of other venous thrombosis and embolism: Secondary | ICD-10-CM | POA: Diagnosis not present

## 2014-01-25 DIAGNOSIS — Z87440 Personal history of urinary (tract) infections: Secondary | ICD-10-CM | POA: Insufficient documentation

## 2014-01-25 DIAGNOSIS — Z880 Allergy status to penicillin: Secondary | ICD-10-CM | POA: Insufficient documentation

## 2014-01-25 DIAGNOSIS — S8990XA Unspecified injury of unspecified lower leg, initial encounter: Secondary | ICD-10-CM | POA: Diagnosis not present

## 2014-01-25 DIAGNOSIS — M25562 Pain in left knee: Secondary | ICD-10-CM

## 2014-01-25 DIAGNOSIS — G43909 Migraine, unspecified, not intractable, without status migrainosus: Secondary | ICD-10-CM | POA: Diagnosis not present

## 2014-01-25 DIAGNOSIS — Z8619 Personal history of other infectious and parasitic diseases: Secondary | ICD-10-CM | POA: Insufficient documentation

## 2014-01-25 DIAGNOSIS — IMO0002 Reserved for concepts with insufficient information to code with codable children: Secondary | ICD-10-CM | POA: Insufficient documentation

## 2014-01-25 DIAGNOSIS — S99929A Unspecified injury of unspecified foot, initial encounter: Principal | ICD-10-CM

## 2014-01-25 DIAGNOSIS — Z860100 Personal history of colon polyps, unspecified: Secondary | ICD-10-CM | POA: Insufficient documentation

## 2014-01-25 DIAGNOSIS — Z8719 Personal history of other diseases of the digestive system: Secondary | ICD-10-CM | POA: Diagnosis not present

## 2014-01-25 DIAGNOSIS — Y929 Unspecified place or not applicable: Secondary | ICD-10-CM | POA: Diagnosis not present

## 2014-01-25 DIAGNOSIS — R011 Cardiac murmur, unspecified: Secondary | ICD-10-CM | POA: Diagnosis not present

## 2014-01-25 DIAGNOSIS — Z79899 Other long term (current) drug therapy: Secondary | ICD-10-CM | POA: Insufficient documentation

## 2014-01-25 DIAGNOSIS — H269 Unspecified cataract: Secondary | ICD-10-CM | POA: Insufficient documentation

## 2014-01-25 DIAGNOSIS — E785 Hyperlipidemia, unspecified: Secondary | ICD-10-CM | POA: Insufficient documentation

## 2014-01-25 DIAGNOSIS — Y9389 Activity, other specified: Secondary | ICD-10-CM | POA: Insufficient documentation

## 2014-01-25 DIAGNOSIS — I10 Essential (primary) hypertension: Secondary | ICD-10-CM | POA: Insufficient documentation

## 2014-01-25 DIAGNOSIS — Z862 Personal history of diseases of the blood and blood-forming organs and certain disorders involving the immune mechanism: Secondary | ICD-10-CM | POA: Insufficient documentation

## 2014-01-25 DIAGNOSIS — X500XXA Overexertion from strenuous movement or load, initial encounter: Secondary | ICD-10-CM | POA: Diagnosis not present

## 2014-01-25 NOTE — ED Notes (Signed)
Pt discharged to home with family. NAD.  

## 2014-01-25 NOTE — ED Notes (Signed)
Left knee pain started yesterday. States that she fell onto her knee yesterday.

## 2014-01-25 NOTE — Discharge Instructions (Signed)
You may take ibuprofen, Tylenol or naproxen for your knee pain. Keep her leg elevated and iced at home. Followup with your primary care physician if no improvement.  Knee Pain The knee is the complex joint between your thigh and your lower leg. It is made up of bones, tendons, ligaments, and cartilage. The bones that make up the knee are:  The femur in the thigh.  The tibia and fibula in the lower leg.  The patella or kneecap riding in the groove on the lower femur. CAUSES  Knee pain is a common complaint with many causes. A few of these causes are:  Injury, such as:  A ruptured ligament or tendon injury.  Torn cartilage.  Medical conditions, such as:  Gout  Arthritis  Infections  Overuse, over training, or overdoing a physical activity. Knee pain can be minor or severe. Knee pain can accompany debilitating injury. Minor knee problems often respond well to self-care measures or get well on their own. More serious injuries may need medical intervention or even surgery. SYMPTOMS The knee is complex. Symptoms of knee problems can vary widely. Some of the problems are:  Pain with movement and weight bearing.  Swelling and tenderness.  Buckling of the knee.  Inability to straighten or extend your knee.  Your knee locks and you cannot straighten it.  Warmth and redness with pain and fever.  Deformity or dislocation of the kneecap. DIAGNOSIS  Determining what is wrong may be very straight forward such as when there is an injury. It can also be challenging because of the complexity of the knee. Tests to make a diagnosis may include:  Your caregiver taking a history and doing a physical exam.  Routine X-rays can be used to rule out other problems. X-rays will not reveal a cartilage tear. Some injuries of the knee can be diagnosed by:  Arthroscopy a surgical technique by which a small video camera is inserted through tiny incisions on the sides of the knee. This procedure is  used to examine and repair internal knee joint problems. Tiny instruments can be used during arthroscopy to repair the torn knee cartilage (meniscus).  Arthrography is a radiology technique. A contrast liquid is directly injected into the knee joint. Internal structures of the knee joint then become visible on X-ray film.  An MRI scan is a non X-ray radiology procedure in which magnetic fields and a computer produce two- or three-dimensional images of the inside of the knee. Cartilage tears are often visible using an MRI scanner. MRI scans have largely replaced arthrography in diagnosing cartilage tears of the knee.  Blood work.  Examination of the fluid that helps to lubricate the knee joint (synovial fluid). This is done by taking a sample out using a needle and a syringe. TREATMENT The treatment of knee problems depends on the cause. Some of these treatments are:  Depending on the injury, proper casting, splinting, surgery, or physical therapy care will be needed.  Give yourself adequate recovery time. Do not overuse your joints. If you begin to get sore during workout routines, back off. Slow down or do fewer repetitions.  For repetitive activities such as cycling or running, maintain your strength and nutrition.  Alternate muscle groups. For example, if you are a weight lifter, work the upper body on one day and the lower body the next.  Either tight or weak muscles do not give the proper support for your knee. Tight or weak muscles do not absorb the stress placed  on the knee joint. Keep the muscles surrounding the knee strong.  Take care of mechanical problems.  If you have flat feet, orthotics or special shoes may help. See your caregiver if you need help.  Arch supports, sometimes with wedges on the inner or outer aspect of the heel, can help. These can shift pressure away from the side of the knee most bothered by osteoarthritis.  A brace called an "unloader" brace also may be  used to help ease the pressure on the most arthritic side of the knee.  If your caregiver has prescribed crutches, braces, wraps or ice, use as directed. The acronym for this is PRICE. This means protection, rest, ice, compression, and elevation.  Nonsteroidal anti-inflammatory drugs (NSAIDs), can help relieve pain. But if taken immediately after an injury, they may actually increase swelling. Take NSAIDs with food in your stomach. Stop them if you develop stomach problems. Do not take these if you have a history of ulcers, stomach pain, or bleeding from the bowel. Do not take without your caregiver's approval if you have problems with fluid retention, heart failure, or kidney problems.  For ongoing knee problems, physical therapy may be helpful.  Glucosamine and chondroitin are over-the-counter dietary supplements. Both may help relieve the pain of osteoarthritis in the knee. These medicines are different from the usual anti-inflammatory drugs. Glucosamine may decrease the rate of cartilage destruction.  Injections of a corticosteroid drug into your knee joint may help reduce the symptoms of an arthritis flare-up. They may provide pain relief that lasts a few months. You may have to wait a few months between injections. The injections do have a small increased risk of infection, water retention, and elevated blood sugar levels.  Hyaluronic acid injected into damaged joints may ease pain and provide lubrication. These injections may work by reducing inflammation. A series of shots may give relief for as long as 6 months.  Topical painkillers. Applying certain ointments to your skin may help relieve the pain and stiffness of osteoarthritis. Ask your pharmacist for suggestions. Many over the-counter products are approved for temporary relief of arthritis pain.  In some countries, doctors often prescribe topical NSAIDs for relief of chronic conditions such as arthritis and tendinitis. A review of  treatment with NSAID creams found that they worked as well as oral medications but without the serious side effects. PREVENTION  Maintain a healthy weight. Extra pounds put more strain on your joints.  Get strong, stay limber. Weak muscles are a common cause of knee injuries. Stretching is important. Include flexibility exercises in your workouts.  Be smart about exercise. If you have osteoarthritis, chronic knee pain or recurring injuries, you may need to change the way you exercise. This does not mean you have to stop being active. If your knees ache after jogging or playing basketball, consider switching to swimming, water aerobics, or other low-impact activities, at least for a few days a week. Sometimes limiting high-impact activities will provide relief.  Make sure your shoes fit well. Choose footwear that is right for your sport.  Protect your knees. Use the proper gear for knee-sensitive activities. Use kneepads when playing volleyball or laying carpet. Buckle your seat belt every time you drive. Most shattered kneecaps occur in car accidents.  Rest when you are tired. SEEK MEDICAL CARE IF:  You have knee pain that is continual and does not seem to be getting better.  SEEK IMMEDIATE MEDICAL CARE IF:  Your knee joint feels hot to the touch  and you have a high fever. MAKE SURE YOU:   Understand these instructions.  Will watch your condition.  Will get help right away if you are not doing well or get worse. Document Released: 03/26/2007 Document Revised: 08/21/2011 Document Reviewed: 03/26/2007 [facility] Patient Information 2015 [place], [place]. This information is not intended to replace advice given to you by your health care provider. Make sure you discuss any questions you have with your health care provider.

## 2014-01-25 NOTE — ED Provider Notes (Signed)
CSN: [id number]     Arrival date & time 01/25/14  1807 History   First MD Initiated Contact with Patient 01/25/14 2007     Chief Complaint  Patient presents with  . Knee Pain     (Consider location/radiation/quality/duration/timing/severity/associated sxs/prior Treatment) HPI Comments: Patient is a 58 year old female who presents to the emergency department complaining of left knee pain x1 day. She reports she started to have a mechanical fall yesterday, she did not land onto her left knee but believes she may have tweaked her left knee when preventing a fall. Pain has been increasing since, located behind her kneecap radiating behind her knee, constant, described as excruciating, rated 10 out of 10. She tried taking hydrocodone today with minimal relief. Denies numbness or tingling.  Patient is a 58 y.o. female presenting with knee pain. The history is provided by the patient.  Knee Pain   Past Medical History  Diagnosis Date  . Anemia   . Arthritis   . Asthma   . History of chicken pox   . Migraine   . Cardiac arrhythmia   . Heart murmur   . MVP (mitral valve prolapse)   . Hypertension   . Hyperlipidemia   . DVT (deep venous thrombosis) 2009  . Recurrent UTI   . Hematuria   . Cataract   . Diverticulosis of colon (without mention of hemorrhage)   . Colon polyp     TUBULAR ADENOMA   Past Surgical History  Procedure Laterality Date  . Cholecystectomy  1997  . Appendectomy  1997  . Tonsillectomy and adenoidectomy  1979  . Partial hysterectomy  1986  . [place]  . Back surgery  2005    herniated disc  . Finger surgery  1976    due to torn tendon/ligament  . Cesarean section      x2  . Bunionectomy  2009  . Bladder tumor excision  2003    Multiple benign tumors removed.  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 Hyperlipidemia Mother   . Hypertension Mother   . Diabetes Mother   . Colon polyps Mother   .  Hypertension Father   . Diabetes Father   . Colon polyps Father   . Kidney disease Paternal Grandmother   . Diabetes Maternal Grandfather   . Colon cancer Neg Hx    History  Substance Use Topics  . Smoking status: Never Smoker   . Smokeless tobacco: Never Used  . Alcohol Use: No   OB History   Grav Para Term Preterm Abortions TAB SAB Ect Mult Living                 Review of Systems  Musculoskeletal:       + L knee pain.  All other systems reviewed and are negative.     Allergies  Latex; Codeine; Ivp dye; Penicillins; and Shellfish allergy  Home Medications   Prior to Admission medications   Medication Sig Start Date End Date Taking? Authorizing Provider  amLODipine (NORVASC) 5 MG tablet Take 1 tablet (5 mg total) by mouth daily. 01/19/14   Midge Minium, MD  beclomethasone (QVAR) 40 MCG/ACT inhaler Inhale 2 puffs into the lungs 2 (two) times daily. 01/19/14   Midge Minium, MD  benzonatate (TESSALON) 100 MG capsule Take 1 capsule (100 mg total) by mouth 2 (two) times daily as needed for cough. 11/17/13   Gwyndolyn Saxon  Daun Peacock, PA-C  hydrochlorothiazide (HYDRODIURIL) 25 MG tablet Take 0.5 tablets (12.5 mg total) by mouth daily. 01/19/14   Midge Minium, MD  mometasone (NASONEX) 50 MCG/ACT nasal spray Place 2 sprays into the nose daily. 01/19/14   Midge Minium, MD  nystatin-triamcinolone ointment (MYCOLOG) APPLY TO AFFECTED AREA 2 TIMES DAILY 01/19/14   Midge Minium, MD  rosuvastatin (CRESTOR) 5 MG tablet Take 1 tablet (5 mg total) by mouth daily. 01/19/14   Midge Minium, MD  traMADol (ULTRAM) 50 MG tablet Take 1 tablet (50 mg total) by mouth every 8 (eight) hours as needed. 04/29/13   Rosalita Chessman, DO  Vitamin D, Ergocalciferol, (DRISDOL) 50000 UNITS CAPS capsule Take 1 capsule (50,000 Units total) by mouth every 7 (seven) days. 01/21/14   Midge Minium, MD   BP 148/77  Pulse 69  Temp(Src) 99.2 F (37.3 C) (Oral)  Resp 18  Ht 5\' 4"  (1.626 m)   Wt 204 lb (92.534 kg)  BMI 35.00 kg/m2  SpO2 97% Physical Exam  Nursing note and vitals reviewed. Constitutional: She is oriented to person, place, and time. She appears well-developed and well-nourished. No distress.  HENT:  Head: Normocephalic and atraumatic.  Mouth/Throat: Oropharynx is clear and moist.  Eyes: Conjunctivae and EOM are normal.  Neck: Normal range of motion. Neck supple.  Cardiovascular: Normal rate, regular rhythm and normal heart sounds.   Pulmonary/Chest: Effort normal and breath sounds normal. No respiratory distress.  Musculoskeletal: Normal range of motion. She exhibits no edema.  Right knee TTP on sides of patella and popliteal space. No swelling or deformity. Full ROM.  Neurological: She is alert and oriented to person, place, and time. No sensory deficit.  Skin: Skin is warm and dry.  Psychiatric: She has a normal mood and affect. Her behavior is normal.    ED Course  Procedures (including critical care time) Labs Review Labs Reviewed - No data to display  Imaging Review Dg Knee 2 Views Left  01/25/2014   CLINICAL DATA:  Knee pain.  EXAM: LEFT KNEE - 1-2 VIEW  COMPARISON:  None.  FINDINGS: The joint spaces are maintained. Minimal degenerative changes. No acute fracture or joint effusion. No osteochondral abnormality.  IMPRESSION: Mild degenerative changes but no acute bony findings.   Electronically Signed   By: Kalman Jewels M.D.   On: 01/25/2014 18:56     EKG Interpretation None      MDM   Final diagnoses:  Left knee pain   Patient presenting with knee pain after catching herself for falling. She is well appearing and in no apparent distress. Vital signs stable. Neurovascularly intact. X-ray without any acute finding. No swelling or deformity. Knee sleeve applied. Advised RICE, NSAIDs. Followup with PCP. She has seen [facility] in the past. Stable for discharge. Return precautions given. Patient states understanding of treatment care  plan and is agreeable.   Illene Labrador, PA-C 01/25/14 2055

## 2014-01-26 NOTE — ED Provider Notes (Signed)
Medical screening examination/treatment/procedure(s) were performed by non-physician practitioner and as supervising physician I was immediately available for consultation/collaboration.   EKG Interpretation None       Orlie Dakin, MD 01/26/14 0002

## 2014-01-26 NOTE — Telephone Encounter (Signed)
Letter printed and placed for Dr Virgil Benedict signature.

## 2014-01-28 ENCOUNTER — Telehealth: Payer: Self-pay | Admitting: Family Medicine

## 2014-01-28 NOTE — Telephone Encounter (Signed)
Dismissal Letter sent by Certified Mail [id number]/[phone]  Dismissal Letter returned Unclaimed 05/25/2014  Dismissal Letter sent by 1st Class Mail 05/27/2014

## 2014-05-12 IMAGING — CR DG ANKLE COMPLETE 3+V*R*
3 series · 3 of 3 positions shown · non-contrast
Comparison: None.

CLINICAL DATA: Right ankle pain

EXAM:
RIGHT ANKLE - COMPLETE 3+ VIEW

[t ankle joint lat right]
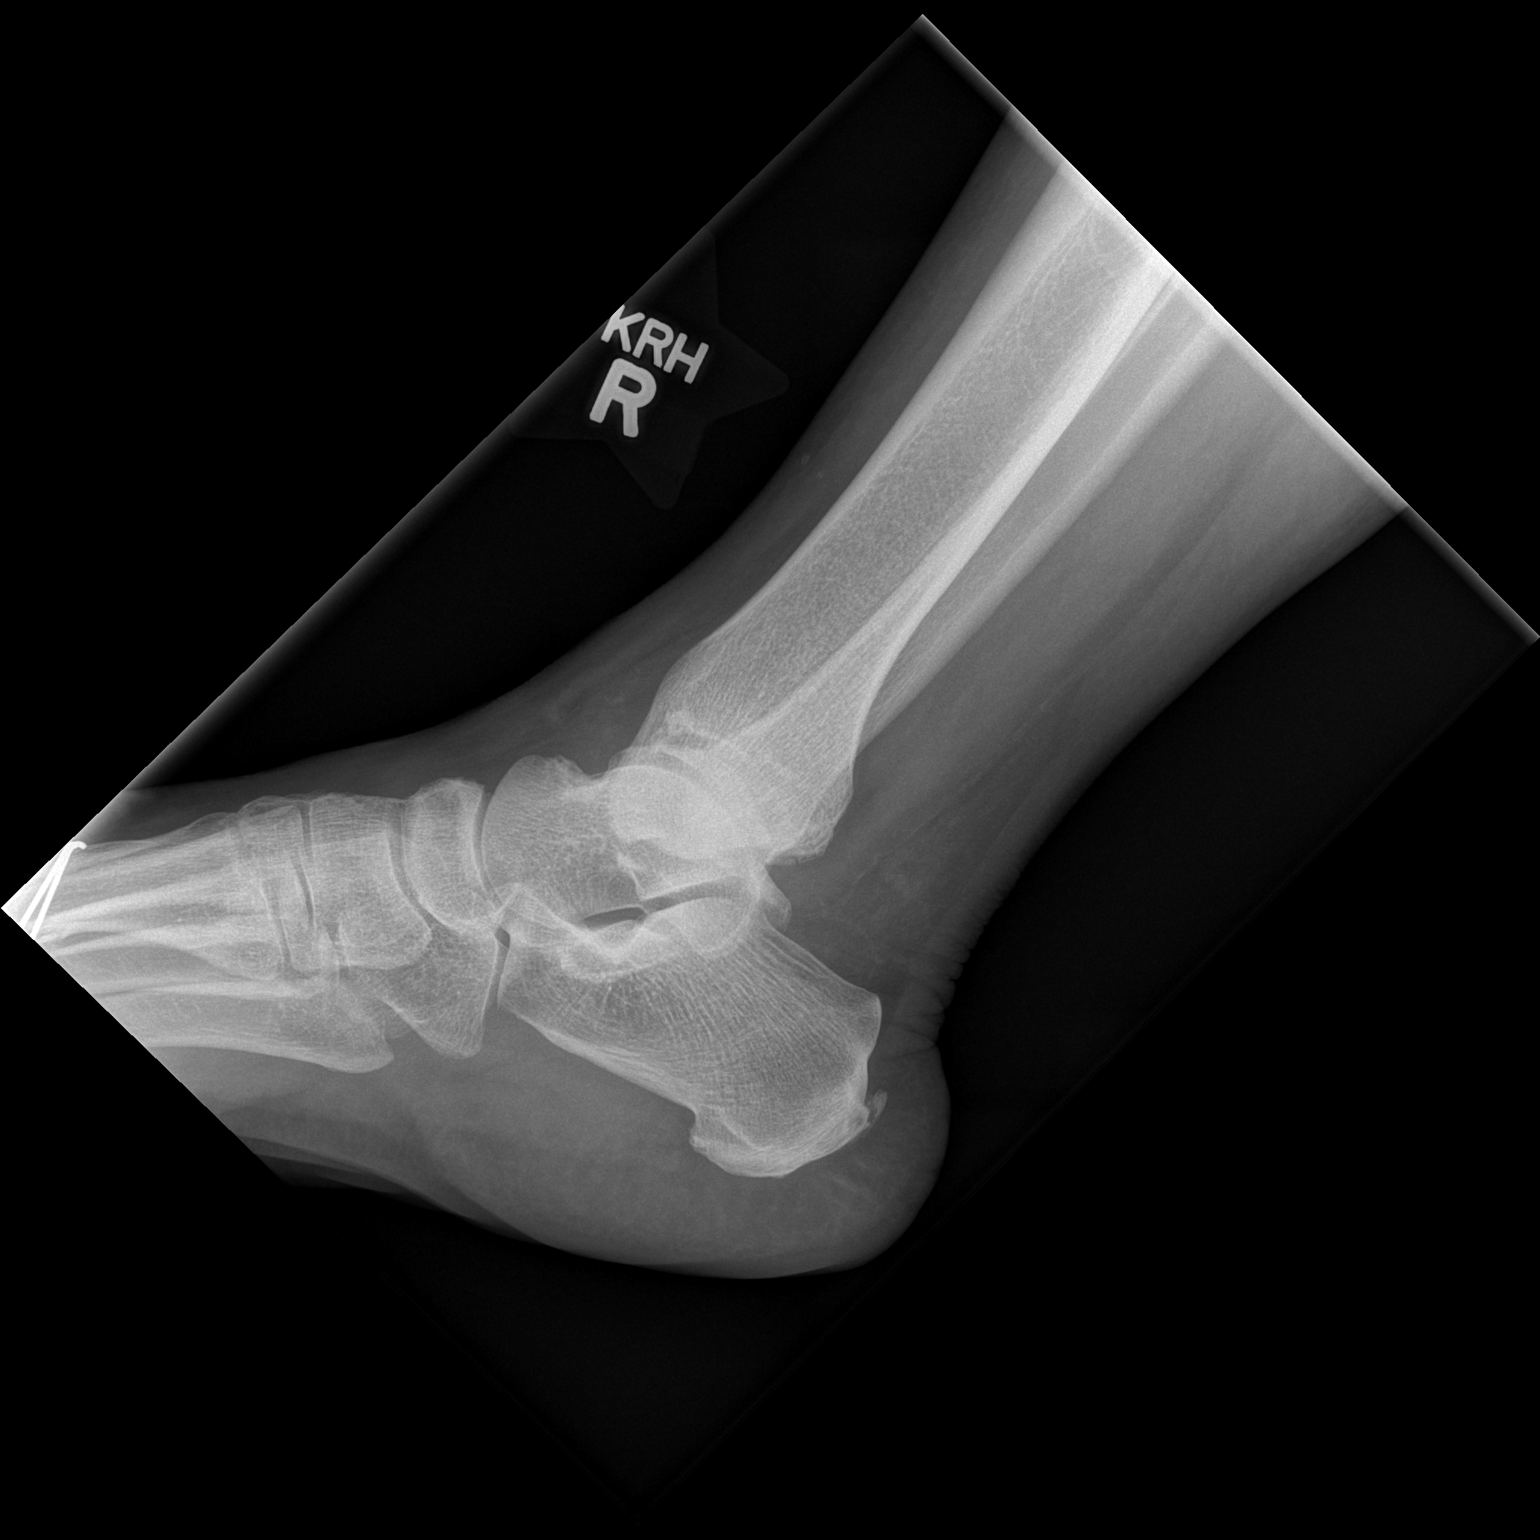

[t ankle joint ap right]
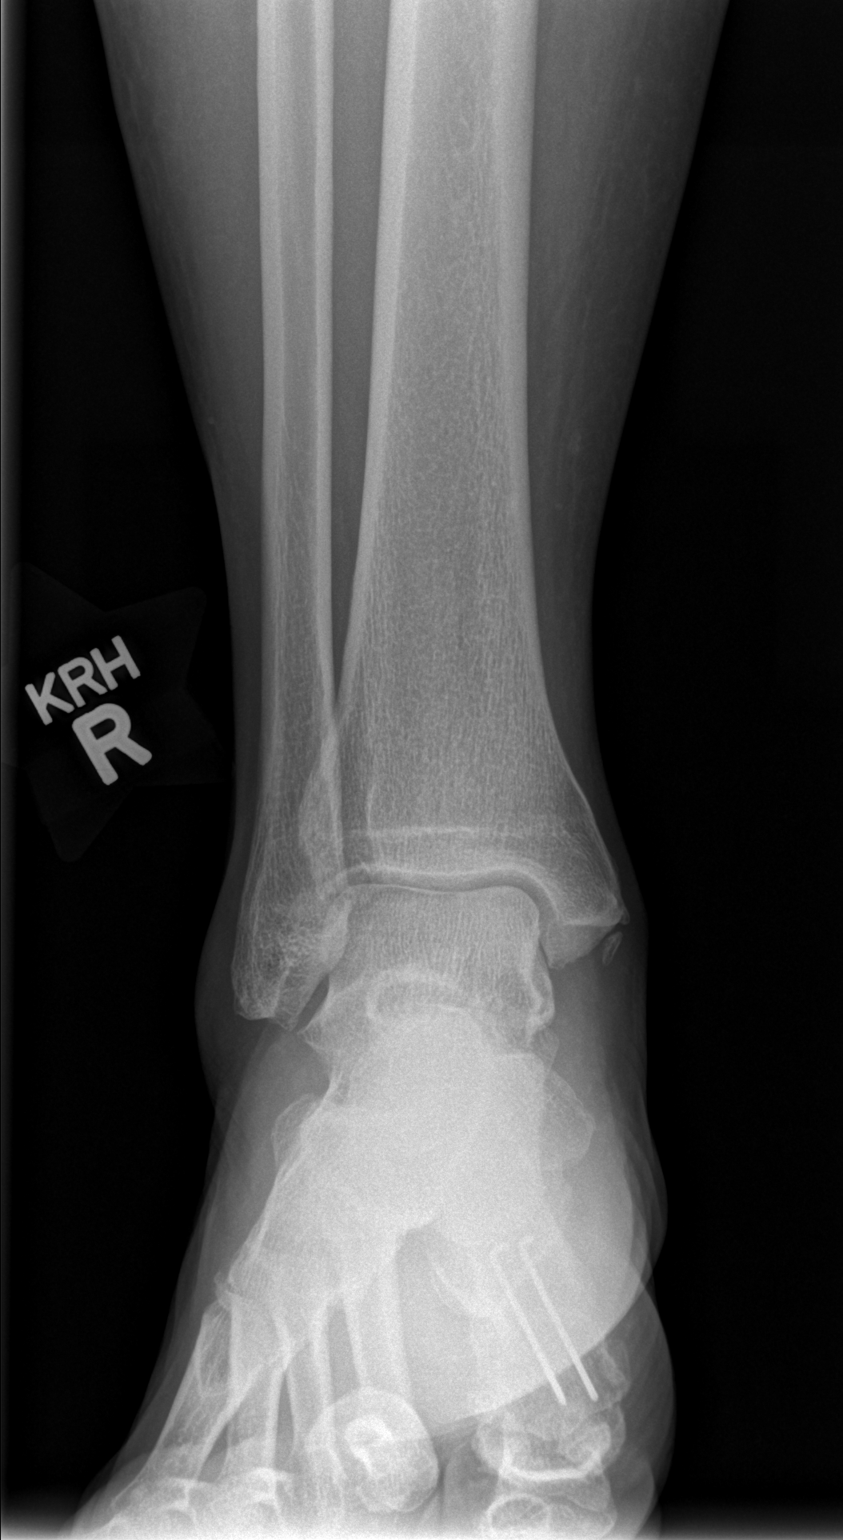

[t ankle joint oblique right]
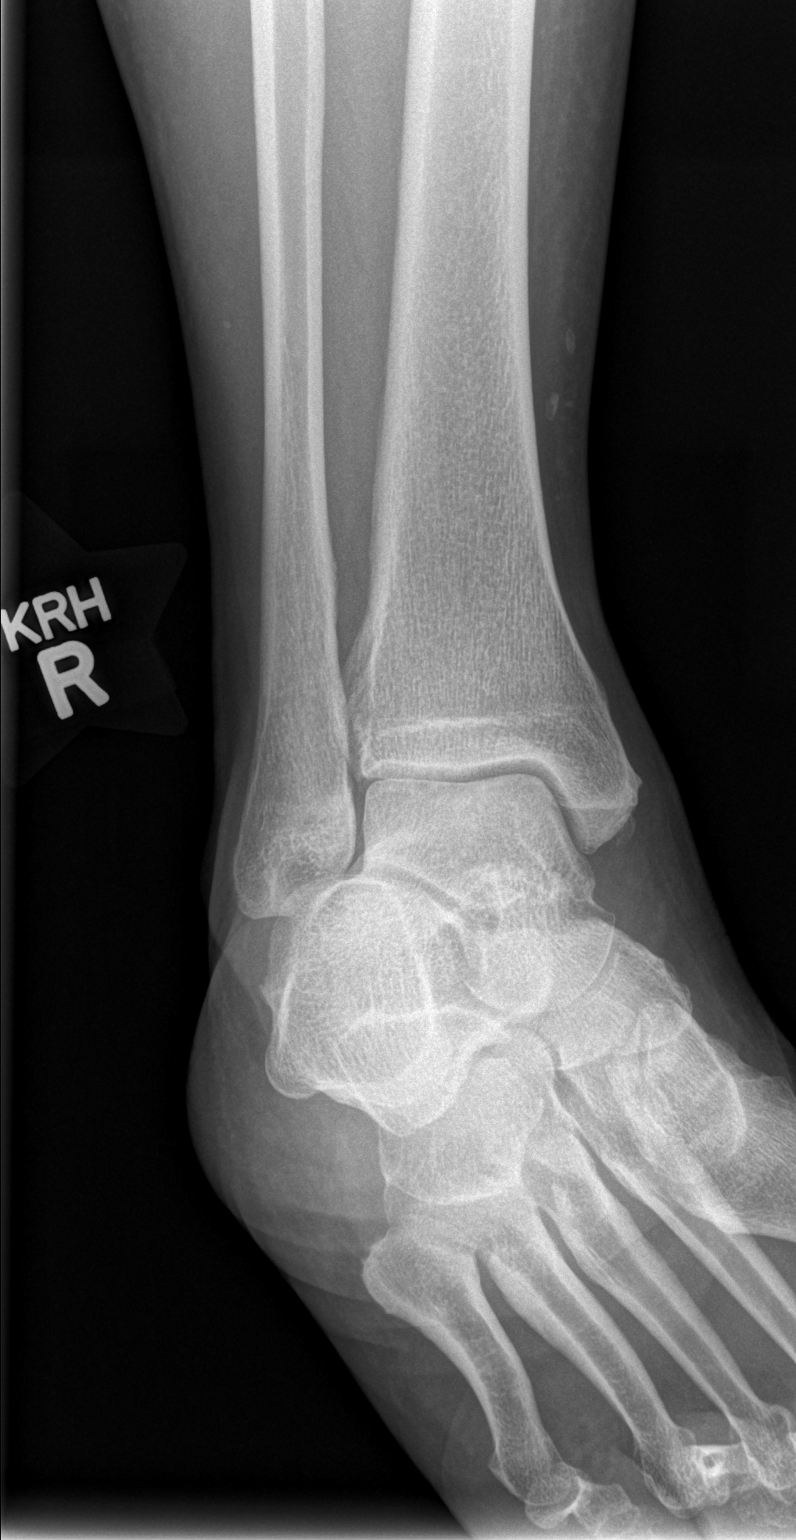

[3 of 3 positions shown; findings below may reference images not displayed]

FINDINGS: There is no evidence of fracture, dislocation, or joint effusion.
Well corticated fragment along the medial malleolus consistent with
prior avulsive injury. There is no evidence of arthropathy or other
focal bone abnormality. Soft tissues are unremarkable.
IMPRESSION: No acute osseous injury of the right ankle.

## 2014-05-12 IMAGING — CR DG FOOT COMPLETE 3+V*R*
3 series · 3 of 3 positions shown · non-contrast
Comparison: None.

CLINICAL DATA: Right foot pain.

EXAM:
RIGHT FOOT COMPLETE - 3+ VIEW

[t foot ap right]
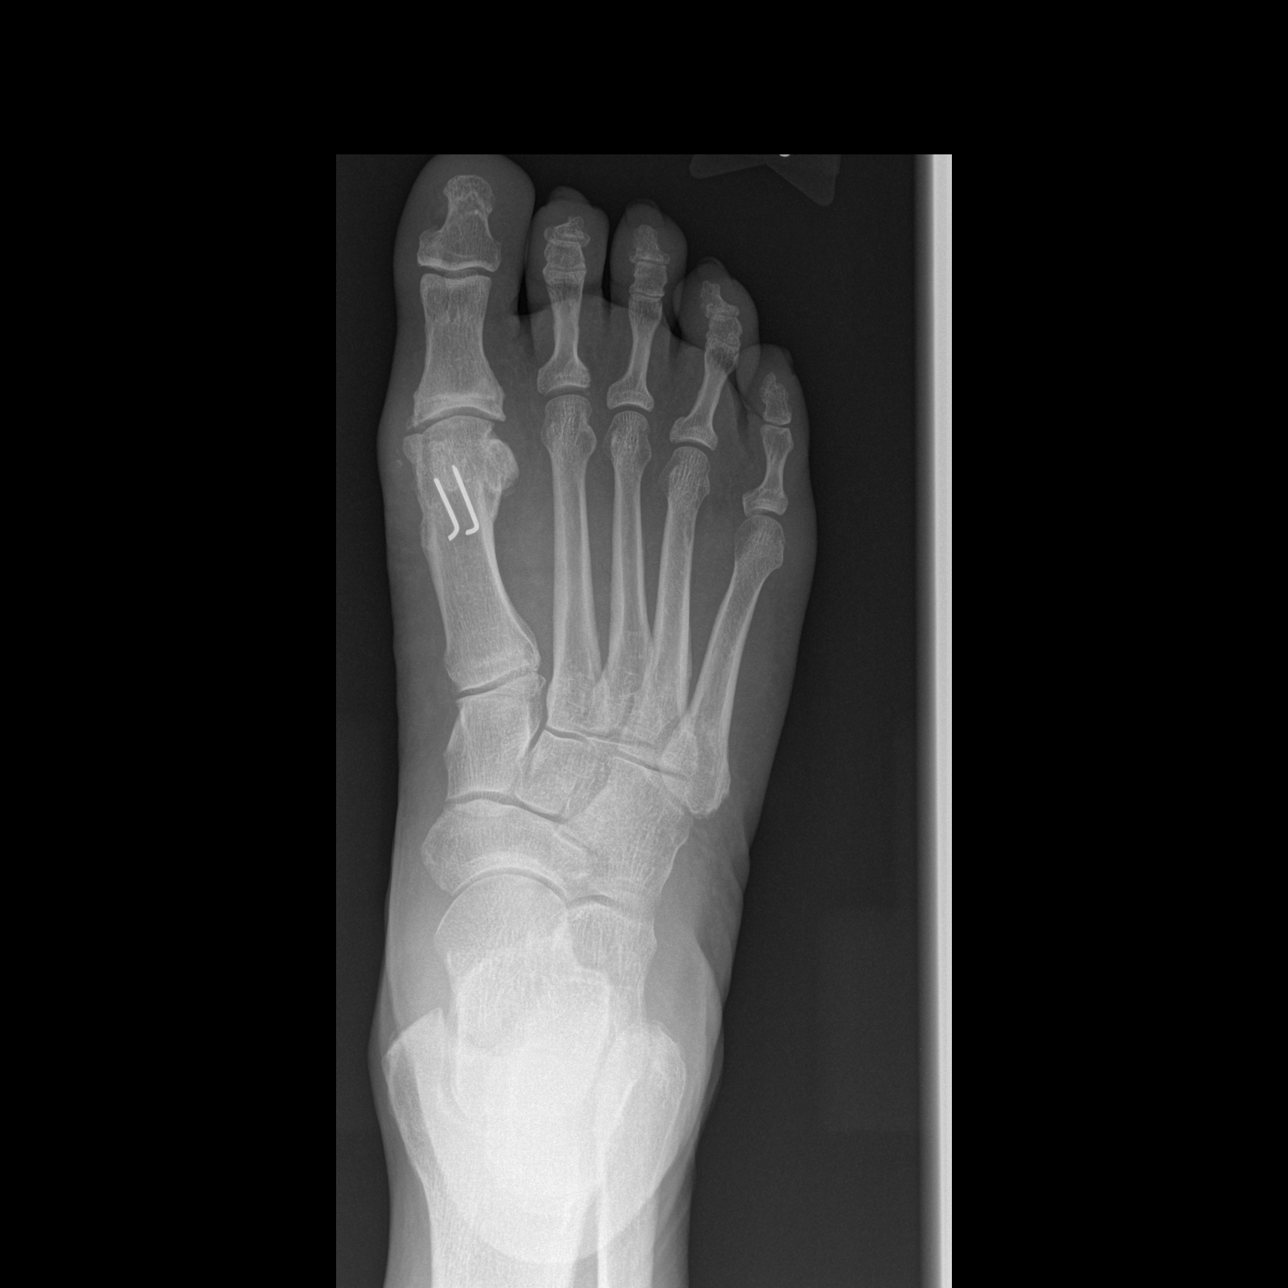

[t foot oblique right]
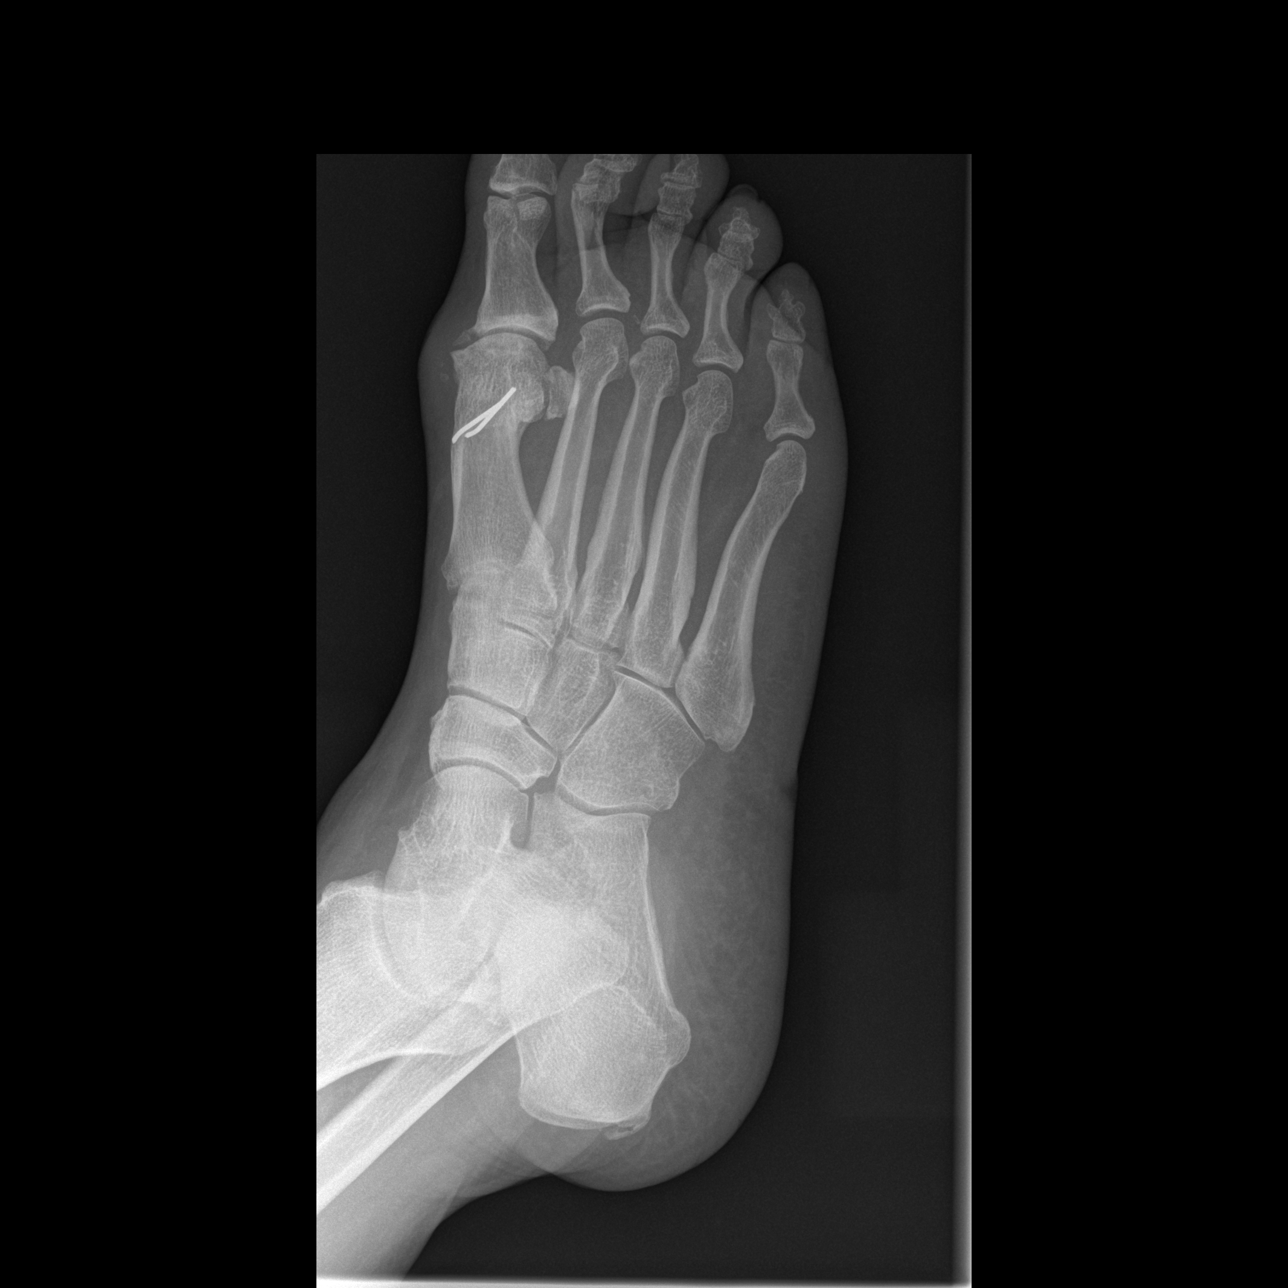

[t foot lat right]
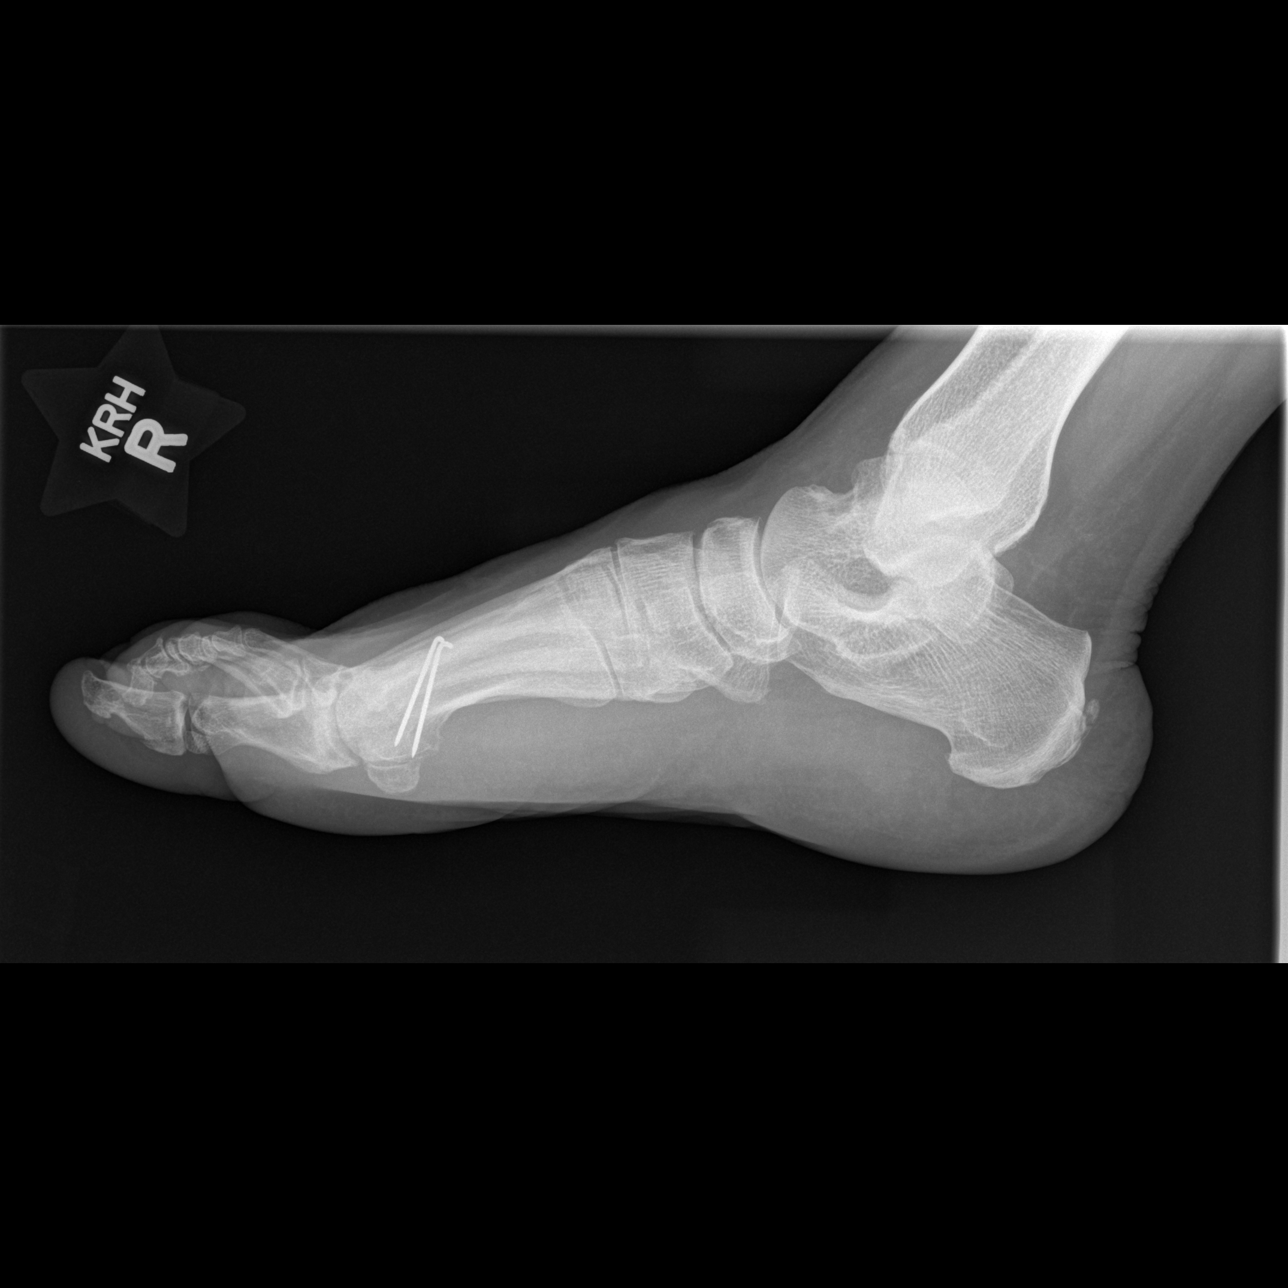

[3 of 3 positions shown; findings below may reference images not displayed]

FINDINGS: There is no fracture or dislocation. There is evidence of prior
hallux valgus repair with 2 K-wires transfixing a healed right 1st
metatarsal osteotomy site. There are mild degenerative changes of
the 1st MTP joint. The soft tissues are unremarkable.
IMPRESSION: No acute osseous injury of the right foot.

## 2014-05-19 ENCOUNTER — Ambulatory Visit (INDEPENDENT_AMBULATORY_CARE_PROVIDER_SITE_OTHER): Payer: 59 | Admitting: Family Medicine

## 2014-05-19 ENCOUNTER — Emergency Department (HOSPITAL_COMMUNITY)
Admission: EM | Admit: 2014-05-19 | Discharge: 2014-05-19 | Disposition: A | Discharge status: Home / Self Care | Payer: 59 | Source: Home / Self Care

## 2014-05-19 VITALS — BP 134/84 | HR 68 | Temp 98.4°F | Resp 18 | Ht 64.0 in | Wt 208.4 lb

## 2014-05-19 DIAGNOSIS — J4520 Mild intermittent asthma, uncomplicated: Secondary | ICD-10-CM

## 2014-05-19 DIAGNOSIS — J069 Acute upper respiratory infection, unspecified: Secondary | ICD-10-CM

## 2014-05-19 MED ORDER — BENZONATATE 200 MG PO CAPS: 200.0000 mg | ORAL_CAPSULE | Freq: Two times a day (BID) | ORAL | Status: DC | PRN

## 2014-05-19 MED ORDER — HYDROCODONE-HOMATROPINE 5-1.5 MG/5ML PO SYRP: ORAL_SOLUTION | ORAL | Status: DC

## 2014-05-19 MED ORDER — AZITHROMYCIN 250 MG PO TABS: ORAL_TABLET | ORAL | Status: DC

## 2014-05-19 MED ORDER — ALBUTEROL SULFATE HFA 108 (90 BASE) MCG/ACT IN AERS: 1.0000 | INHALATION_SPRAY | RESPIRATORY_TRACT | Status: AC | PRN

## 2014-05-19 NOTE — Progress Notes (Addendum)
Subjective:    Patient ID: Traci Vasquez, female    DOB: 03-31-1956, 58 y.o.   MRN: [id number]  This chart was scribed for Merri Ray, MD by Hilda Lias, ED Scribe. The patient's care was started at 3:19 PM.   HPI  HPI Comments: Traci Vasquez is a 58 y.o. female with Asthma who presents to [facility] complaining of a worsening productive cough with yellow phlegm, sore throat, and chills that began four days ago. Review of [facility] states she was seen and treated for acute bronchitis on June 8th. Pt states that four days ago her cough began as dry and non-productive, and has been worsening since it started. Pt states that her sore throat began three days ago, and that her cough became productive with yellow phlegm one day thereafter two days ago. Pt also notes that she began wheezing two days ago. Pt reports a positive sick contact with a colleague at work and states that she had to leave work today because of her symptoms. Pt denies fever.   Uses Qvar only as needed - few times per year only when asthma sx's. Has used albuterol in the past, but expired and no recent prescription. Some wheezing as above.    Patient Active Problem List   Diagnosis Date Noted  . Carpal tunnel syndrome 01/19/2014  . Acute bronchitis 11/17/2013  . Right foot pain 04/29/2013  . DM w/o complication type II 40/98/1191  . Abnormal TSH 03/24/2013  . Urinary incontinence, overflow 03/24/2013  . Diverticulosis of colon without hemorrhage 08/28/2012  . Personal history of colonic polyps 08/28/2012  . Radicular low back pain 01/05/2012  . Migraine headache 11/29/2011  . Plantar fasciitis 11/01/2011  . Gastroenteritis 08/15/2011  . Hematuria, microscopic 08/15/2011  . Orthostatic lightheadedness 08/15/2011  . Hand numbness 01/23/2011  . Other screening mammogram 01/23/2011  . General medical examination 01/23/2011  . Post-menopausal 01/23/2011  . Rash 01/04/2011  . HTN (hypertension)  11/24/2010  . Hyperlipidemia 11/24/2010  . Rhinitis 11/24/2010  . History of DVT of lower extremity 11/24/2010   Past Medical History  Diagnosis Date  . Anemia   . Arthritis   . Asthma   . History of chicken pox   . Migraine   . Cardiac arrhythmia   . Heart murmur   . MVP (mitral valve prolapse)   . Hypertension   . Hyperlipidemia   . DVT (deep venous thrombosis) 2009  . Recurrent UTI   . Hematuria   . Cataract   . Diverticulosis of colon (without mention of hemorrhage)   . Colon polyp     TUBULAR ADENOMA   Past Surgical History  Procedure Laterality Date  . Cholecystectomy  1997  . Appendectomy  1997  . Tonsillectomy and adenoidectomy  1979  . Partial hysterectomy  1986  . [place]  . Back surgery  2005    herniated disc  . Finger surgery  1976    due to torn tendon/ligament  . Cesarean section      x2  . Bunionectomy  2009  . Bladder tumor excision  2003    Multiple benign tumors removed.  Still has one large tumor    Allergies  Allergen Reactions  . Latex     Other reaction(s): Unknown Itch, hives, asthma attack  . Codeine Other (See Comments)    Feels like something is crawling on her head   . Ivp Dye [Iodinated Diagnostic Agents] Rash  Uncoded Allergy. Allergen: ivp dye  08/26/2012:Pt states about 30 yrs ago she had MRI with contrast and had a full body rash.  . Penicillins Itching, Swelling and Rash  . Shellfish Allergy Rash    Pt reports rash over whole body   Prior to Admission medications   Medication Sig Start Date End Date Taking? Authorizing Provider  beclomethasone (QVAR) 40 MCG/ACT inhaler Inhale 2 puffs into the lungs 2 (two) times daily. 01/19/14  Yes Midge Minium, MD  benzonatate (TESSALON) 100 MG capsule Take 1 capsule (100 mg total) by mouth 2 (two) times daily as needed for cough. 11/17/13  Yes Brunetta Jeans, PA-C  hydrochlorothiazide (HYDRODIURIL) 25 MG tablet Take 0.5 tablets (12.5 mg total) by mouth daily.  01/19/14  Yes Midge Minium, MD  mometasone (NASONEX) 50 MCG/ACT nasal spray Place 2 sprays into the nose daily. 01/19/14  Yes Midge Minium, MD  nystatin-triamcinolone ointment (MYCOLOG) APPLY TO AFFECTED AREA 2 TIMES DAILY 01/19/14  Yes Midge Minium, MD  rosuvastatin (CRESTOR) 5 MG tablet Take 1 tablet (5 mg total) by mouth daily. 01/19/14  Yes Midge Minium, MD  traMADol (ULTRAM) 50 MG tablet Take 1 tablet (50 mg total) by mouth every 8 (eight) hours as needed. 04/29/13  Yes Rosalita Chessman, DO  Vitamin D, Ergocalciferol, (DRISDOL) 50000 UNITS CAPS capsule Take 1 capsule (50,000 Units total) by mouth every 7 (seven) days. 01/21/14  Yes Midge Minium, MD  amLODipine (NORVASC) 5 MG tablet Take 1 tablet (5 mg total) by mouth daily. Patient not taking: Reported on 05/19/2014 01/19/14   Midge Minium, MD   History   Social History  . Marital Status: Married    Spouse Name: Iona Beard    Number of Children: 3  . Years of Education: N/A   Occupational History  . Teacher    Social History Main Topics  . Smoking status: Never Smoker   . Smokeless tobacco: Never Used  . Alcohol Use: No  . Drug Use: No  . Sexual Activity: Not on file   Other Topics Concern  . Not on file   Social History Narrative      Review of Systems  Constitutional: Positive for chills. Negative for fever.  HENT: Positive for sore throat.   Respiratory: Positive for cough and wheezing.        Objective:   Physical Exam  Constitutional: She is oriented to person, place, and time. She appears well-developed and well-nourished. No distress.  HENT:  Head: Normocephalic and atraumatic.  Right Ear: Hearing, tympanic membrane, external ear and ear canal normal.  Left Ear: Hearing, tympanic membrane, external ear and ear canal normal.  Nose: Nose normal.  Mouth/Throat: Oropharynx is clear and moist. No oropharyngeal exudate.  Eyes: Conjunctivae and EOM are normal. Pupils are equal, round, and  reactive to light.  Cardiovascular: Normal rate, regular rhythm, normal heart sounds and intact distal pulses.   No murmur heard. Pulmonary/Chest: Effort normal and breath sounds normal. No stridor. No respiratory distress. She has no wheezes. She has no rhonchi.  Few coarse breath sounds in right lower lobe/ right middle lobe  Neurological: She is alert and oriented to person, place, and time.  Skin: Skin is warm and dry. No rash noted.  Psychiatric: She has a normal mood and affect. Her behavior is normal.  Vitals reviewed.   Filed Vitals:   05/19/14 1420  BP: 134/84  Pulse: 68  Temp: 98.4 F (36.9 C)  TempSrc:  Oral  Resp: 18  Height: 5\' 4"  (1.626 m)  Weight: 208 lb 6.4 oz (94.53 kg)  SpO2: 97%         Assessment & Plan:   Traci Vasquez is a 58 y.o. female Asthmatic bronchitis, mild intermittent, uncomplicated - Plan: albuterol (PROVENTIL HFA;VENTOLIN HFA) 108 (90 BASE) MCG/ACT inhaler, benzonatate (TESSALON) 200 MG capsule, azithromycin (ZITHROMAX) 250 MG tablet, HYDROcodone-homatropine (HYCODAN) 5-1.5 MG/5ML syrup  Acute upper respiratory infection - Plan: albuterol (PROVENTIL HFA;VENTOLIN HFA) 108 (90 BASE) MCG/ACT inhaler, benzonatate (TESSALON) 200 MG capsule, azithromycin (ZITHROMAX) 250 MG tablet, HYDROcodone-homatropine (HYCODAN) 5-1.5 MG/5ML syrup  Prior underlying asthma with possible early CAP or asthmatic bronchitis.   -start Z pak, incr tessalon to 200mg  TID prn, hycodan at night if not wheezing/dyspneic. Caution on respiratory depression with asthmatic sx's discussed. Other sx care as below.   -refilled albuterol if needed. rtc/ER precautions given    Meds ordered this encounter  Medications  . albuterol (PROVENTIL HFA;VENTOLIN HFA) 108 (90 BASE) MCG/ACT inhaler    Sig: Inhale 1-2 puffs into the lungs every 4 (four) hours as needed for wheezing or shortness of breath.    Dispense:  1 Inhaler    Refill:  0  . benzonatate (TESSALON) 200 MG capsule    Sig:  Take 1 capsule (200 mg total) by mouth 2 (two) times daily as needed for cough.    Dispense:  20 capsule    Refill:  0  . azithromycin (ZITHROMAX) 250 MG tablet    Sig: Take 2 pills by mouth on day 1, then 1 pill by mouth per day on days 2 through 5.    Dispense:  6 tablet    Refill:  0  . HYDROcodone-homatropine (HYCODAN) 5-1.5 MG/5ML syrup    Sig: 70m by mouth a bedtime as needed for cough.    Dispense:  120 mL    Refill:  0   Patient Instructions  Although your initial symptoms sound viral, you do have some congestion on the R side of your chest, and may be starting into an asthmatic bronchitis or early community acquired pneumonia.  Albuterol if needed for wheezing (if needing more than 2-3 times per day or greater than 3 days use - return for recheck as may need prednisone) Start azithromycin. Can try stronger dose of tessalon 3 times per day, continue lozenges, voice rest, fluids.  IF stronger cough mediAcute Bronchitis Bronchitis is inflammation of the airways that extend from the windpipe into the lungs (bronchi). The inflammation often causes mucus to develop. This leads to a cough, which is the most common symptom of bronchitis.  In acute bronchitis, the condition usually develops suddenly and goes away over time, usually in a couple weeks. Smoking, allergies, and asthma can make bronchitis worse. Repeated episodes of bronchitis may cause further lung problems.  CAUSES Acute bronchitis is most often caused by the same virus that causes a cold. The virus can spread from person to person (contagious) through coughing, sneezing, and touching contaminated objects. SIGNS AND SYMPTOMS   Cough.   Fever.   Coughing up mucus.   Body aches.   Chest congestion.   Chills.   Shortness of breath.   Sore throat.  DIAGNOSIS  Acute bronchitis is usually diagnosed through a physical exam. Your health care provider will also ask you questions about your medical history. Tests,  such as chest X-rays, are sometimes done to rule out other conditions.  TREATMENT  Acute bronchitis usually goes away in a couple  weeks. Oftentimes, no medical treatment is necessary. Medicines are sometimes given for relief of fever or cough. Antibiotic medicines are usually not needed but may be prescribed in certain situations. In some cases, an inhaler may be recommended to help reduce shortness of breath and control the cough. A cool mist vaporizer may also be used to help thin bronchial secretions and make it easier to clear the chest.  HOME CARE INSTRUCTIONS  Get plenty of rest.   Drink enough fluids to keep your urine clear or pale yellow (unless you have a medical condition that requires fluid restriction). Increasing fluids may help thin your respiratory secretions (sputum) and reduce chest congestion, and it will prevent dehydration.   Take medicines only as directed by your health care provider.  If you were prescribed an antibiotic medicine, finish it all even if you start to feel better.  Avoid smoking and secondhand smoke. Exposure to cigarette smoke or irritating chemicals will make bronchitis worse. If you are a smoker, consider using nicotine gum or skin patches to help control withdrawal symptoms. Quitting smoking will help your lungs heal faster.   Reduce the chances of another bout of acute bronchitis by washing your hands frequently, avoiding people with cold symptoms, and trying not to touch your hands to your mouth, nose, or eyes.   Keep all follow-up visits as directed by your health care provider.  SEEK MEDICAL CARE IF: Your symptoms do not improve after 1 week of treatment.  SEEK IMMEDIATE MEDICAL CARE IF:  You develop an increased fever or chills.   You have chest pain.   You have severe shortness of breath.  You have bloody sputum.   You develop dehydration.  You faint or repeatedly feel like you are going to pass out.  You develop repeated  vomiting.  You develop a severe headache. MAKE SURE YOU:   Understand these instructions.  Will watch your condition.  Will get help right away if you are not doing well or get worse. Document Released: 07/06/2004 Document Revised: 10/13/2013 Document Reviewed: 11/19/2012 [facility] Patient Information 2015 [place], [place]. This information is not intended to replace advice given to you by your health care provider. Make sure you discuss any questions you have with your health care provider. cine needed at bedtime - can use hydrocodone cough syrup, but ONLY if NOT wheezing or short of breath. (use albuterol for asthmatic cough).  Return to the clinic or go to the nearest emergency room if any of your symptoms worsen or new symptoms occur.    Upper Respiratory Infection, Adult An upper respiratory infection (URI) is also sometimes known as the common cold. The upper respiratory tract includes the nose, sinuses, throat, trachea, and bronchi. Bronchi are the airways leading to the lungs. Most people improve within 1 week, but symptoms can last up to 2 weeks. A residual cough may last even longer.  CAUSES Many different viruses can infect the tissues lining the upper respiratory tract. The tissues become irritated and inflamed and often become very moist. Mucus production is also common. A cold is contagious. You can easily spread the virus to others by oral contact. This includes kissing, sharing a glass, coughing, or sneezing. Touching your mouth or nose and then touching a surface, which is then touched by another person, can also spread the virus. SYMPTOMS  Symptoms typically develop 1 to 3 days after you come in contact with a cold virus. Symptoms vary from person to person. They may include:  Runny  nose.  Sneezing.  Nasal congestion.  Sinus irritation.  Sore throat.  Loss of voice (laryngitis).  Cough.  Fatigue.  Muscle aches.  Loss of appetite.  Headache.  Low-grade  fever. DIAGNOSIS  You might diagnose your own cold based on familiar symptoms, since most people get a cold 2 to 3 times a year. Your caregiver can confirm this based on your exam. Most importantly, your caregiver can check that your symptoms are not due to another disease such as strep throat, sinusitis, pneumonia, asthma, or epiglottitis. Blood tests, throat tests, and X-rays are not necessary to diagnose a common cold, but they may sometimes be helpful in excluding other more serious diseases. Your caregiver will decide if any further tests are required. RISKS AND COMPLICATIONS  You may be at risk for a more severe case of the common cold if you smoke cigarettes, have chronic heart disease (such as heart failure) or lung disease (such as asthma), or if you have a weakened immune system. The very young and very old are also at risk for more serious infections. Bacterial sinusitis, middle ear infections, and bacterial pneumonia can complicate the common cold. The common cold can worsen asthma and chronic obstructive pulmonary disease (COPD). Sometimes, these complications can require emergency medical care and may be life-threatening. PREVENTION  The best way to protect against getting a cold is to practice good hygiene. Avoid oral or hand contact with people with cold symptoms. Wash your hands often if contact occurs. There is no clear evidence that vitamin C, vitamin E, echinacea, or exercise reduces the chance of developing a cold. However, it is always recommended to get plenty of rest and practice good nutrition. TREATMENT  Treatment is directed at relieving symptoms. There is no cure. Antibiotics are not effective, because the infection is caused by a virus, not by bacteria. Treatment may include:  Increased fluid intake. Sports drinks offer valuable electrolytes, sugars, and fluids.  Breathing heated mist or steam (vaporizer or shower).  Eating chicken soup or other clear broths, and  maintaining good nutrition.  Getting plenty of rest.  Using gargles or lozenges for comfort.  Controlling fevers with ibuprofen or acetaminophen as directed by your caregiver.  Increasing usage of your inhaler if you have asthma. Zinc gel and zinc lozenges, taken in the first 24 hours of the common cold, can shorten the duration and lessen the severity of symptoms. Pain medicines may help with fever, muscle aches, and throat pain. A variety of non-prescription medicines are available to treat congestion and runny nose. Your caregiver can make recommendations and may suggest nasal or lung inhalers for other symptoms.  HOME CARE INSTRUCTIONS   Only take over-the-counter or prescription medicines for pain, discomfort, or fever as directed by your caregiver.  Use a warm mist humidifier or inhale steam from a shower to increase air moisture. This may keep secretions moist and make it easier to breathe.  Drink enough water and fluids to keep your urine clear or pale yellow.  Rest as needed.  Return to work when your temperature has returned to normal or as your caregiver advises. You may need to stay home longer to avoid infecting others. You can also use a face mask and careful hand washing to prevent spread of the virus. SEEK MEDICAL CARE IF:   After the first few days, you feel you are getting worse rather than better.  You need your caregiver's advice about medicines to control symptoms.  You develop chills, worsening shortness of  breath, or brown or red sputum. These may be signs of pneumonia.  You develop yellow or brown nasal discharge or pain in the face, especially when you bend forward. These may be signs of sinusitis.  You develop a fever, swollen neck glands, pain with swallowing, or white areas in the back of your throat. These may be signs of strep throat. SEEK IMMEDIATE MEDICAL CARE IF:   You have a fever.  You develop severe or persistent headache, ear pain, sinus pain,  or chest pain.  You develop wheezing, a prolonged cough, cough up blood, or have a change in your usual mucus (if you have chronic lung disease).  You develop sore muscles or a stiff neck. Document Released: 11/22/2000 Document Revised: 08/21/2011 Document Reviewed: 09/03/2013 [facility] Patient Information 2015 [place], [place]. This information is not intended to replace advice given to you by your health care provider. Make sure you discuss any questions you have with your health care provider.   I personally performed the services described in this documentation, which was scribed in my presence. The recorded information has been reviewed and considered, and addended by me as needed.

## 2014-05-19 NOTE — Patient Instructions (Signed)
Although your initial symptoms sound viral, you do have some congestion on the R side of your chest, and may be starting into an asthmatic bronchitis or early community acquired pneumonia.  Albuterol if needed for wheezing (if needing more than 2-3 times per day or greater than 3 days use - return for recheck as may need prednisone) Start azithromycin. Can try stronger dose of tessalon 3 times per day, continue lozenges, voice rest, fluids.  IF stronger cough mediAcute Bronchitis Bronchitis is inflammation of the airways that extend from the windpipe into the lungs (bronchi). The inflammation often causes mucus to develop. This leads to a cough, which is the most common symptom of bronchitis.  In acute bronchitis, the condition usually develops suddenly and goes away over time, usually in a couple weeks. Smoking, allergies, and asthma can make bronchitis worse. Repeated episodes of bronchitis may cause further lung problems.  CAUSES Acute bronchitis is most often caused by the same virus that causes a cold. The virus can spread from person to person (contagious) through coughing, sneezing, and touching contaminated objects. SIGNS AND SYMPTOMS   Cough.   Fever.   Coughing up mucus.   Body aches.   Chest congestion.   Chills.   Shortness of breath.   Sore throat.  DIAGNOSIS  Acute bronchitis is usually diagnosed through a physical exam. Your health care provider will also ask you questions about your medical history. Tests, such as chest X-rays, are sometimes done to rule out other conditions.  TREATMENT  Acute bronchitis usually goes away in a couple weeks. Oftentimes, no medical treatment is necessary. Medicines are sometimes given for relief of fever or cough. Antibiotic medicines are usually not needed but may be prescribed in certain situations. In some cases, an inhaler may be recommended to help reduce shortness of breath and control the cough. A cool mist vaporizer may also  be used to help thin bronchial secretions and make it easier to clear the chest.  HOME CARE INSTRUCTIONS  Get plenty of rest.   Drink enough fluids to keep your urine clear or pale yellow (unless you have a medical condition that requires fluid restriction). Increasing fluids may help thin your respiratory secretions (sputum) and reduce chest congestion, and it will prevent dehydration.   Take medicines only as directed by your health care provider.  If you were prescribed an antibiotic medicine, finish it all even if you start to feel better.  Avoid smoking and secondhand smoke. Exposure to cigarette smoke or irritating chemicals will make bronchitis worse. If you are a smoker, consider using nicotine gum or skin patches to help control withdrawal symptoms. Quitting smoking will help your lungs heal faster.   Reduce the chances of another bout of acute bronchitis by washing your hands frequently, avoiding people with cold symptoms, and trying not to touch your hands to your mouth, nose, or eyes.   Keep all follow-up visits as directed by your health care provider.  SEEK MEDICAL CARE IF: Your symptoms do not improve after 1 week of treatment.  SEEK IMMEDIATE MEDICAL CARE IF:  You develop an increased fever or chills.   You have chest pain.   You have severe shortness of breath.  You have bloody sputum.   You develop dehydration.  You faint or repeatedly feel like you are going to pass out.  You develop repeated vomiting.  You develop a severe headache. MAKE SURE YOU:   Understand these instructions.  Will watch your condition.  Will get  help right away if you are not doing well or get worse. Document Released: 07/06/2004 Document Revised: 10/13/2013 Document Reviewed: 11/19/2012 [facility] Patient Information 2015 [place], [place]. This information is not intended to replace advice given to you by your health care provider. Make sure you discuss any questions you have  with your health care provider. cine needed at bedtime - can use hydrocodone cough syrup, but ONLY if NOT wheezing or short of breath. (use albuterol for asthmatic cough).  Return to the clinic or go to the nearest emergency room if any of your symptoms worsen or new symptoms occur.    Upper Respiratory Infection, Adult An upper respiratory infection (URI) is also sometimes known as the common cold. The upper respiratory tract includes the nose, sinuses, throat, trachea, and bronchi. Bronchi are the airways leading to the lungs. Most people improve within 1 week, but symptoms can last up to 2 weeks. A residual cough may last even longer.  CAUSES Many different viruses can infect the tissues lining the upper respiratory tract. The tissues become irritated and inflamed and often become very moist. Mucus production is also common. A cold is contagious. You can easily spread the virus to others by oral contact. This includes kissing, sharing a glass, coughing, or sneezing. Touching your mouth or nose and then touching a surface, which is then touched by another person, can also spread the virus. SYMPTOMS  Symptoms typically develop 1 to 3 days after you come in contact with a cold virus. Symptoms vary from person to person. They may include:  Runny nose.  Sneezing.  Nasal congestion.  Sinus irritation.  Sore throat.  Loss of voice (laryngitis).  Cough.  Fatigue.  Muscle aches.  Loss of appetite.  Headache.  Low-grade fever. DIAGNOSIS  You might diagnose your own cold based on familiar symptoms, since most people get a cold 2 to 3 times a year. Your caregiver can confirm this based on your exam. Most importantly, your caregiver can check that your symptoms are not due to another disease such as strep throat, sinusitis, pneumonia, asthma, or epiglottitis. Blood tests, throat tests, and X-rays are not necessary to diagnose a common cold, but they may sometimes be helpful in excluding  other more serious diseases. Your caregiver will decide if any further tests are required. RISKS AND COMPLICATIONS  You may be at risk for a more severe case of the common cold if you smoke cigarettes, have chronic heart disease (such as heart failure) or lung disease (such as asthma), or if you have a weakened immune system. The very young and very old are also at risk for more serious infections. Bacterial sinusitis, middle ear infections, and bacterial pneumonia can complicate the common cold. The common cold can worsen asthma and chronic obstructive pulmonary disease (COPD). Sometimes, these complications can require emergency medical care and may be life-threatening. PREVENTION  The best way to protect against getting a cold is to practice good hygiene. Avoid oral or hand contact with people with cold symptoms. Wash your hands often if contact occurs. There is no clear evidence that vitamin C, vitamin E, echinacea, or exercise reduces the chance of developing a cold. However, it is always recommended to get plenty of rest and practice good nutrition. TREATMENT  Treatment is directed at relieving symptoms. There is no cure. Antibiotics are not effective, because the infection is caused by a virus, not by bacteria. Treatment may include:  Increased fluid intake. Sports drinks offer valuable electrolytes, sugars, and fluids.  Breathing heated mist or steam (vaporizer or shower).  Eating chicken soup or other clear broths, and maintaining good nutrition.  Getting plenty of rest.  Using gargles or lozenges for comfort.  Controlling fevers with ibuprofen or acetaminophen as directed by your caregiver.  Increasing usage of your inhaler if you have asthma. Zinc gel and zinc lozenges, taken in the first 24 hours of the common cold, can shorten the duration and lessen the severity of symptoms. Pain medicines may help with fever, muscle aches, and throat pain. A variety of non-prescription medicines  are available to treat congestion and runny nose. Your caregiver can make recommendations and may suggest nasal or lung inhalers for other symptoms.  HOME CARE INSTRUCTIONS   Only take over-the-counter or prescription medicines for pain, discomfort, or fever as directed by your caregiver.  Use a warm mist humidifier or inhale steam from a shower to increase air moisture. This may keep secretions moist and make it easier to breathe.  Drink enough water and fluids to keep your urine clear or pale yellow.  Rest as needed.  Return to work when your temperature has returned to normal or as your caregiver advises. You may need to stay home longer to avoid infecting others. You can also use a face mask and careful hand washing to prevent spread of the virus. SEEK MEDICAL CARE IF:   After the first few days, you feel you are getting worse rather than better.  You need your caregiver's advice about medicines to control symptoms.  You develop chills, worsening shortness of breath, or brown or red sputum. These may be signs of pneumonia.  You develop yellow or brown nasal discharge or pain in the face, especially when you bend forward. These may be signs of sinusitis.  You develop a fever, swollen neck glands, pain with swallowing, or white areas in the back of your throat. These may be signs of strep throat. SEEK IMMEDIATE MEDICAL CARE IF:   You have a fever.  You develop severe or persistent headache, ear pain, sinus pain, or chest pain.  You develop wheezing, a prolonged cough, cough up blood, or have a change in your usual mucus (if you have chronic lung disease).  You develop sore muscles or a stiff neck. Document Released: 11/22/2000 Document Revised: 08/21/2011 Document Reviewed: 09/03/2013 [facility] Patient Information 2015 [place], [place]. This information is not intended to replace advice given to you by your health care provider. Make sure you discuss any questions you have with your  health care provider.

## 2014-05-26 ENCOUNTER — Telehealth: Payer: Self-pay

## 2014-05-26 NOTE — Telephone Encounter (Signed)
Patient is still ill. Can she have another Z-pack? Also needs an updated RTW note.  [phone]

## 2014-05-27 NOTE — Telephone Encounter (Signed)
Called and left VM to have pt call back with symptoms.

## 2014-06-06 ENCOUNTER — Ambulatory Visit (INDEPENDENT_AMBULATORY_CARE_PROVIDER_SITE_OTHER): Payer: 59 | Admitting: Physician Assistant

## 2014-06-06 VITALS — BP 150/80 | HR 56 | Temp 98.1°F | Resp 18 | Ht 64.0 in | Wt 207.6 lb

## 2014-06-06 DIAGNOSIS — G43901 Migraine, unspecified, not intractable, with status migrainosus: Secondary | ICD-10-CM

## 2014-06-06 MED ORDER — PROMETHAZINE HCL 25 MG/ML IJ SOLN
25.0000 mg | Freq: Once | INTRAMUSCULAR | Status: AC
  Administered 2014-06-06: 25 mg via INTRAMUSCULAR

## 2014-06-06 MED ORDER — KETOROLAC TROMETHAMINE 60 MG/2ML IM SOLN
60.0000 mg | Freq: Once | INTRAMUSCULAR | Status: AC
  Administered 2014-06-06: 60 mg via INTRAMUSCULAR

## 2014-06-06 NOTE — Progress Notes (Signed)
Subjective:    Patient ID: Traci Vasquez, female    DOB: 1955-06-16, 58 y.o.   MRN: [id number]   PCP: PROVIDER NOT IN SYSTEM (Dr. Georgeanna Lea, MD in [place])  Chief Complaint  Patient presents with  . Migraine    pain started on last night around 8pm  . Nausea    Allergies  Allergen Reactions  . Latex     Other reaction(s): Unknown Itch, hives, asthma attack  . Codeine Other (See Comments)    Feels like something is crawling on her head   . Ivp Dye [Iodinated Diagnostic Agents] Rash    Uncoded Allergy. Allergen: ivp dye  08/26/2012:Pt states about 30 yrs ago she had MRI with contrast and had a full body rash.  . Penicillins Itching, Swelling and Rash  . Shellfish Allergy Rash    Pt reports rash over whole body    Patient Active Problem List   Diagnosis Date Noted  . Carpal tunnel syndrome 01/19/2014  . Acute bronchitis 11/17/2013  . Right foot pain 04/29/2013  . DM w/o complication type II [id number]/45/0388  . Abnormal TSH 03/24/2013  . Urinary incontinence, overflow 03/24/2013  . Diverticulosis of colon without hemorrhage 08/28/2012  . Personal history of colonic polyps 08/28/2012  . Radicular low back pain 01/05/2012  . Migraine headache 11/29/2011  . Plantar fasciitis 11/01/2011  . Gastroenteritis 08/15/2011  . Hematuria, microscopic 08/15/2011  . Orthostatic lightheadedness 08/15/2011  . Hand numbness 01/23/2011  . Other screening mammogram 01/23/2011  . General medical examination 01/23/2011  . Post-menopausal 01/23/2011  . Rash 01/04/2011  . HTN (hypertension) 11/24/2010  . Hyperlipidemia 11/24/2010  . Rhinitis 11/24/2010  . History of DVT of lower extremity 11/24/2010    Prior to Admission medications   Medication Sig Start Date End Date Taking? Authorizing Provider  albuterol (PROVENTIL HFA;VENTOLIN HFA) 108 (90 BASE) MCG/ACT inhaler Inhale 1-2 puffs into the lungs every 4 (four) hours as needed for wheezing or shortness of breath. 05/19/14  Yes  Wendie Agreste, MD  amLODipine (NORVASC) 5 MG tablet Take 1 tablet (5 mg total) by mouth daily. 01/19/14  Yes Midge Minium, MD  beclomethasone (QVAR) 40 MCG/ACT inhaler Inhale 2 puffs into the lungs 2 (two) times daily. 01/19/14  Yes Midge Minium, MD  hydrochlorothiazide (HYDRODIURIL) 25 MG tablet Take 0.5 tablets (12.5 mg total) by mouth daily. 01/19/14  Yes Midge Minium, MD  loratadine (CLARITIN) 10 MG tablet Take 10 mg by mouth. 03/16/14 03/16/15 Yes Historical Provider, MD  mometasone (NASONEX) 50 MCG/ACT nasal spray Place 2 sprays into the nose daily. 01/19/14  Yes Midge Minium, MD  nystatin-triamcinolone ointment (MYCOLOG) APPLY TO AFFECTED AREA 2 TIMES DAILY 01/19/14  Yes Midge Minium, MD  rosuvastatin (CRESTOR) 5 MG tablet Take 1 tablet (5 mg total) by mouth daily. 01/19/14  Yes Midge Minium, MD  traMADol (ULTRAM) 50 MG tablet Take 1 tablet (50 mg total) by mouth every 8 (eight) hours as needed. 04/29/13  Yes Rosalita Chessman, DO  Vitamin D, Ergocalciferol, (DRISDOL) 50000 UNITS CAPS capsule Take 1 capsule (50,000 Units total) by mouth every 7 (seven) days. 01/21/14  Yes Midge Minium, MD    Medical, Surgical, Family and Social History reviewed and updated.  HPI  Presents for treatment of migraine headache. This HA began yesterday evening. Not resolved with Excedrin mirgaine or Tramadol. In general, it's her typical migraine, with nausea and phonophobia. Photophobia only if she looks directly at a light or  the sun, which she knows to avoid. No numbness or tingling, no weakness that is new-she has numbness in the RIGHT arm/hand present x several months. Does feel weird, like she's not really in her body. Has felt this way twice before, both associated with bradycardia. Previously, bradycardia has been associated with medications used for her migraine therapy-Imitrex Stat Dose and Inderal PO.  No CP, SOB, dizziness. She is recovering from a respiratory  illness and still has some cough and post-nasal drainage.  She thinks that the drainage in her throat may be contributing to the nausea.  Review of Systems As above.    Objective:   Physical Exam  Constitutional: She is oriented to person, place, and time. Vital signs are normal. She appears well-developed and well-nourished. She is active and cooperative. No distress.  BP 150/80 mmHg  Pulse 56  Temp(Src) 98.1 F (36.7 C) (Oral)  Resp 18  Ht 5\' 4"  (1.626 m)  Wt 207 lb 9.6 oz (94.167 kg)  BMI 35.62 kg/m2  SpO2 96%  HENT:  Head: Normocephalic and atraumatic.  Right Ear: Hearing, tympanic membrane, external ear and ear canal normal.  Left Ear: Hearing, tympanic membrane, external ear and ear canal normal.  Nose: Nose normal.  Mouth/Throat: Uvula is midline and oropharynx is clear and moist. No oropharyngeal exudate.  Eyes: Conjunctivae, EOM and lids are normal. Pupils are equal, round, and reactive to light. No scleral icterus.  Fundoscopic exam:      The right eye shows no hemorrhage and no papilledema. The right eye shows red reflex.       The left eye shows no hemorrhage and no papilledema. The left eye shows red reflex.  Neck: Normal range of motion. Neck supple. No tracheal tenderness present. No thyromegaly present.  Cardiovascular: Regular rhythm, normal heart sounds and normal pulses.   No extrasystoles are present. Bradycardia present.  PMI is not displaced.   Pulses:      Radial pulses are 2+ on the right side, and 2+ on the left side.  Pulmonary/Chest: Effort normal and breath sounds normal. No stridor.  Lymphadenopathy:       Head (right side): No tonsillar, no preauricular, no posterior auricular and no occipital adenopathy present.       Head (left side): No tonsillar, no preauricular, no posterior auricular and no occipital adenopathy present.    She has no cervical adenopathy.       Right: No supraclavicular adenopathy present.       Left: No supraclavicular  adenopathy present.  Neurological: She is alert and oriented to person, place, and time. She has normal strength. No cranial nerve deficit or sensory deficit. Coordination normal.  Skin: Skin is warm, dry and intact. No rash noted. No cyanosis or erythema. Nails show no clubbing.  Psychiatric: She has a normal mood and affect. Her speech is normal and behavior is normal.          Assessment & Plan:  1. Migraine with status migrainosus, not intractable, unspecified migraine type Suspect the bradycardia is due to the nausea, and possible reduced oral hydration, which in turn exacerbates the nausea. Supportive care. RTC or follow-up with PCP if symptoms worsen/persist. - promethazine (PHENERGAN) injection 25 mg; Inject 1 mL (25 mg total) into the muscle once. - ketorolac (TORADOL) injection 60 mg; Inject 2 mLs (60 mg total) into the muscle once.   Fara Chute, PA-C Physician Assistant-Certified [facility]

## 2014-06-06 NOTE — Patient Instructions (Signed)
Get plenty of rest and drink at least 64 ounces of water daily. 

## 2014-09-14 ENCOUNTER — Other Ambulatory Visit: Payer: Self-pay | Admitting: Family Medicine

## 2014-09-14 ENCOUNTER — Ambulatory Visit (INDEPENDENT_AMBULATORY_CARE_PROVIDER_SITE_OTHER): Payer: 59 | Admitting: Family Medicine

## 2014-09-14 VITALS — BP 170/84 | HR 75 | Temp 97.6°F | Resp 18 | Ht 64.0 in | Wt 211.8 lb

## 2014-09-14 DIAGNOSIS — M10272 Drug-induced gout, left ankle and foot: Secondary | ICD-10-CM

## 2014-09-14 DIAGNOSIS — I10 Essential (primary) hypertension: Secondary | ICD-10-CM

## 2014-09-14 DIAGNOSIS — M109 Gout, unspecified: Secondary | ICD-10-CM | POA: Insufficient documentation

## 2014-09-14 LAB — COMPREHENSIVE METABOLIC PANEL
ALT: 10 U/L (ref 0–35)
AST: 13 U/L (ref 0–37)
Albumin: 4.2 g/dL (ref 3.5–5.2)
Alkaline Phosphatase: 71 U/L (ref 39–117)
BUN: 17 mg/dL (ref 6–23)
CO2: 30 mEq/L (ref 19–32)
Calcium: 9.8 mg/dL (ref 8.4–10.5)
Chloride: 102 mEq/L (ref 96–112)
Creat: 1.04 mg/dL (ref 0.50–1.10)
Glucose, Bld: 98 mg/dL (ref 70–99)
Potassium: 4.6 mEq/L (ref 3.5–5.3)
Sodium: 140 mEq/L (ref 135–145)
Total Bilirubin: 0.3 mg/dL (ref 0.2–1.2)
Total Protein: 8.5 g/dL — ABNORMAL HIGH (ref 6.0–8.3)

## 2014-09-14 LAB — URIC ACID: Uric Acid, Serum: 9.5 mg/dL — ABNORMAL HIGH (ref 2.4–7.0)

## 2014-09-14 MED ORDER — PREDNISONE 20 MG PO TABS: 40.0000 mg | ORAL_TABLET | Freq: Every day | ORAL | Status: DC

## 2014-09-14 MED ORDER — LOSARTAN POTASSIUM 100 MG PO TABS: 100.0000 mg | ORAL_TABLET | Freq: Every day | ORAL | Status: AC

## 2014-09-14 MED ORDER — INDOMETHACIN 50 MG PO CAPS: 50.0000 mg | ORAL_CAPSULE | Freq: Three times a day (TID) | ORAL | Status: AC | PRN

## 2014-09-14 NOTE — Patient Instructions (Signed)

## 2014-09-14 NOTE — Telephone Encounter (Signed)
Med denied, pt has been dismissed from practice.

## 2014-09-14 NOTE — Progress Notes (Signed)
° °  Subjective:    Patient ID: Traci Vasquez, female    DOB: November 16, 1955, 59 y.o.   MRN: [id number] This chart was scribed for Robyn Haber, MD by Zola Button, Medical Scribe. This patient was seen in Room 4 and the patient's care was started at 9:51 AM.   HPI HPI Comments: Traci Vasquez is a 59 y.o. female with a hx of HTN who presents to the [facility] complaining of gradual onset, constant left big toe pain with swelling that started yesterday. She states she has had similar flare-ups in her right foot before over the past few years; this is her 3rd flare-up but the first time it has occurred in her left foot. The pain is worsened even with light touches.  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  Review of Systems  Musculoskeletal: Positive for joint swelling and arthralgias.       Objective:   Physical Exam CONSTITUTIONAL: Well developed/well nourished HEAD: Normocephalic/atraumatic EYES: EOM/PERRL ENMT: Mucous membranes moist NECK: supple no meningeal signs SPINE: entire spine nontender CV: S1/S2 noted, no murmurs/rubs/gallops noted LUNGS: Lungs are clear to auscultation bilaterally, no apparent distress ABDOMEN: soft, nontender, no rebound or guarding GU: no cva tenderness NEURO: Pt is awake/alert, moves all extremitiesx4 EXTREMITIES: Left MTP joint is swollen, red and very tender. She cannot move it without pain. SKIN: warm, color normal PSYCH: no abnormalities of mood noted      Assessment & Plan:   This chart was scribed in my presence and reviewed by me personally.    ICD-9-CM ICD-10-CM   1. Acute drug-induced gout of left foot 274.9 M10.272 Comprehensive metabolic panel     Uric Acid     predniSONE (DELTASONE) 20 MG tablet     indomethacin (INDOCIN) 50 MG capsule  2. Essential hypertension 401.9 I10 losartan (COZAAR) 100 MG tablet     Signed, Robyn Haber, MD

## 2014-09-16 ENCOUNTER — Other Ambulatory Visit: Payer: Self-pay | Admitting: Family Medicine

## 2014-09-16 ENCOUNTER — Telehealth: Payer: Self-pay | Admitting: Radiology

## 2014-09-16 DIAGNOSIS — M109 Gout, unspecified: Secondary | ICD-10-CM

## 2014-09-16 MED ORDER — ALLOPURINOL 100 MG PO TABS: 100.0000 mg | ORAL_TABLET | Freq: Every day | ORAL | Status: AC

## 2014-09-16 NOTE — Telephone Encounter (Signed)
Dr L: Can you order pt's allopurinol? She called again asking about it.

## 2014-10-13 ENCOUNTER — Ambulatory Visit (INDEPENDENT_AMBULATORY_CARE_PROVIDER_SITE_OTHER): Payer: 59 | Admitting: Family Medicine

## 2014-10-13 ENCOUNTER — Other Ambulatory Visit: Payer: Self-pay | Admitting: Family Medicine

## 2014-10-13 VITALS — BP 140/90 | HR 81 | Temp 98.0°F | Ht 63.5 in | Wt 215.0 lb

## 2014-10-13 DIAGNOSIS — G500 Trigeminal neuralgia: Secondary | ICD-10-CM

## 2014-10-13 DIAGNOSIS — G518 Other disorders of facial nerve: Secondary | ICD-10-CM

## 2014-10-13 NOTE — Progress Notes (Signed)
Subjective:  This chart was scribed for Robyn Haber MD, by Tamsen Roers, at [facility].  This patient was seen in room 10 and the patient's care was started at 8:31 PM.    Patient ID: Traci Vasquez, female    DOB: [birth year], 59 y.o.   MRN: [id number]  HPI  HPI Comments: Traci Vasquez is a 59 y.o. female who presents to [facility] for intermittent non radiating left sided lightening bolt head pain onset two weeks ago but states it is more frequent now.  Patient describes a pain that is lateral to her left side and shoots across her face.   Patient compares the pain to a toothache. She states that she has had (at the most) 3-4 episodes every day onset last night. She is afraid of driving when the pain starts because it is a stunning sensation.  She has associated symptoms of shoulder pain.  She is not currently taking her gout medication and has been taking Losartan for two weeks.  Patient is a Marine scientist.   She has no other complaints today.   Patient Active Problem List   Diagnosis Date Noted   Gout 09/14/2014   Carpal tunnel syndrome 01/19/2014   Acute bronchitis 11/17/2013   Right foot pain 32/95/1884   DM w/o complication type II 16/60/6301   Abnormal TSH 03/24/2013   Urinary incontinence, overflow 03/24/2013   Diverticulosis of colon without hemorrhage 08/28/2012   Personal history of colonic polyps 08/28/2012   Radicular low back pain 01/05/2012   Migraine headache 11/29/2011   Plantar fasciitis 11/01/2011   Gastroenteritis 08/15/2011   Hematuria, microscopic 08/15/2011   Orthostatic lightheadedness 08/15/2011   Hand numbness 01/23/2011   Other screening mammogram 01/23/2011   General medical examination 01/23/2011   Post-menopausal 01/23/2011   Rash 01/04/2011   HTN (hypertension) 11/24/2010   Hyperlipidemia 11/24/2010   Rhinitis 11/24/2010   History of DVT of lower extremity 11/24/2010   Past  Medical History  Diagnosis Date   Anemia    Arthritis    Asthma    History of chicken pox    Migraine    Cardiac arrhythmia    Heart murmur    MVP (mitral valve prolapse)    Hypertension    Hyperlipidemia    DVT (deep venous thrombosis) 2009   Recurrent UTI    Hematuria    Cataract    Diverticulosis of colon (without mention of hemorrhage)    Colon polyp     TUBULAR ADENOMA   Past Surgical History  Procedure Laterality Date   Cholecystectomy  1997   Appendectomy  1997   Tonsillectomy and adenoidectomy  1979   Partial hysterectomy  [place] surgery  2005    herniated disc   Finger surgery  1976    due to torn tendon/ligament   Cesarean section      x2   Bunionectomy  2009   Bladder tumor excision  2003    Multiple benign tumors removed.  Still has one large tumor    Allergies  Allergen Reactions   Latex     Other reaction(s): Unknown Itch, hives, asthma attack   Codeine Other (See Comments)    Feels like something is crawling on her head    Ivp Dye [Iodinated Diagnostic Agents] Rash    Uncoded Allergy. Allergen: ivp dye  08/26/2012:Pt states about 30 yrs ago she had MRI with  contrast and had a full body rash.   Penicillins Itching, Swelling and Rash   Shellfish Allergy Rash    Pt reports rash over whole body   Prior to Admission medications   Medication Sig Start Date End Date Taking? Authorizing Provider  albuterol (PROVENTIL HFA;VENTOLIN HFA) 108 (90 BASE) MCG/ACT inhaler Inhale 1-2 puffs into the lungs every 4 (four) hours as needed for wheezing or shortness of breath. 05/19/14  Yes Wendie Agreste, MD  allopurinol (ZYLOPRIM) 100 MG tablet Take 1 tablet (100 mg total) by mouth daily. 09/16/14  Yes Robyn Haber, MD  beclomethasone (QVAR) 40 MCG/ACT inhaler Inhale 2 puffs into the lungs 2 (two) times daily. 01/19/14  Yes Midge Minium, MD  indomethacin (INDOCIN) 50 MG capsule Take 1 capsule (50  mg total) by mouth 3 (three) times daily as needed. 09/14/14  Yes Robyn Haber, MD  loratadine (CLARITIN) 10 MG tablet Take 10 mg by mouth. 03/16/14 03/16/15 Yes Historical Provider, MD  losartan (COZAAR) 100 MG tablet Take 1 tablet (100 mg total) by mouth daily. 09/14/14  Yes Robyn Haber, MD  mometasone (NASONEX) 50 MCG/ACT nasal spray Place 2 sprays into the nose daily. 01/19/14  Yes Midge Minium, MD  nystatin-triamcinolone ointment (MYCOLOG) APPLY TO AFFECTED AREA 2 TIMES DAILY 01/19/14  Yes Midge Minium, MD  rosuvastatin (CRESTOR) 5 MG tablet Take 1 tablet (5 mg total) by mouth daily. 01/19/14  Yes Midge Minium, MD   History   Social History   Marital Status: Married    Spouse Name: Iona Beard   Number of Children: 3   Years of Education: N/A   Occupational History   Cytogeneticist    Social History Main Topics   Smoking status: Never Smoker    Smokeless tobacco: Never Used   Alcohol Use: No   Drug Use: No   Sexual Activity: Not on file   Other Topics Concern   Not on file   Social History Narrative   Lives with her husband in [place], [place]    Review of Systems  Constitutional: Negative for fever and chills.  Respiratory: Negative for cough and shortness of breath.   Cardiovascular: Negative for chest pain.  Gastrointestinal: Negative for nausea and vomiting.  Musculoskeletal: Positive for myalgias. Negative for neck pain.  Neurological: Positive for headaches.       Objective:   Physical Exam  Constitutional: She appears well-developed and well-nourished. No distress.  HENT:  Head: Normocephalic and atraumatic.  Eyes: Right eye exhibits no discharge. Left eye exhibits no discharge.  Pulmonary/Chest: Effort normal. No respiratory distress.  Neurological: She is alert. Coordination normal.  Skin: No rash noted. She is not diaphoretic.  Psychiatric: She has a normal mood and affect. Her behavior is normal.  Nursing note and  vitals reviewed.   Filed Vitals:   10/13/14 2028  BP: 140/90  Pulse: 81  Temp: 98 F (36.7 C)  TempSrc: Oral  Height: 5' 3.5" (1.613 m)  Weight: 215 lb (97.523 kg)  SpO2: 94%  Head and neck exam is essentially normal. Review of her tympanic membranes, ear canals, fundi, extraocular motion, pupils, palpation of the face, inspection of the oropharynx are all normal. There is no rash or tenderness on the face. There is no adenopathy.     Assessment & Plan:   This chart was scribed in my presence and reviewed by me personally.    ICD-9-CM ICD-10-CM   1. 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

## 2014-10-13 NOTE — Addendum Note (Signed)
Addended by: Tommas Olp B on: 10/13/2014 09:22 PM   Modules accepted: Orders

## 2014-10-13 NOTE — Patient Instructions (Signed)
Trigeminal Neuralgia  Trigeminal neuralgia is a nerve disorder that causes sudden attacks of severe facial pain. It is caused by damage to the trigeminal nerve, a major nerve in the face. It is more common in women and in the elderly, although it can also happen in younger patients. Attacks last from a few seconds to several minutes and can occur from a couple of times per year to several times per day. Trigeminal neuralgia can be a very distressing and disabling condition. Surgery may be needed in very severe cases if medical treatment does not give relief.  HOME CARE INSTRUCTIONS    If your caregiver prescribed medication to help prevent attacks, take as directed.   To help prevent attacks:   Chew on the unaffected side of the mouth.   Avoid touching your face.   Avoid blasts of hot or cold air.   Men may wish to grow a beard to avoid having to shave.  SEEK IMMEDIATE MEDICAL CARE IF:   Pain is unbearable and your medicine does not help.   You develop new, unexplained symptoms (problems).   You have problems that may be related to a medication you are taking.  Document Released: 05/26/2000 Document Revised: 08/21/2011 Document Reviewed: 03/26/2009  [employer] Patient Information 2015 [employer]. This information is not intended to replace advice given to you by your health care provider. Make sure you discuss any questions you have with your health care provider.

## 2014-10-14 LAB — CBC WITH DIFFERENTIAL/PLATELET
Basophils Absolute: 0 10*3/uL (ref 0.0–0.1)
Basophils Relative: 0 % (ref 0–1)
Eosinophils Absolute: 0.2 10*3/uL (ref 0.0–0.7)
Eosinophils Relative: 3 % (ref 0–5)
HCT: 33.3 % — ABNORMAL LOW (ref 36.0–46.0)
Hemoglobin: 11.1 g/dL — ABNORMAL LOW (ref 12.0–15.0)
Lymphocytes Relative: 54 % — ABNORMAL HIGH (ref 12–46)
Lymphs Abs: 4.1 10*3/uL — ABNORMAL HIGH (ref 0.7–4.0)
MCH: 28.9 pg (ref 26.0–34.0)
MCHC: 33.3 g/dL (ref 30.0–36.0)
MCV: 86.7 fL (ref 78.0–100.0)
MPV: 12.3 fL (ref 8.6–12.4)
Monocytes Absolute: 0.5 10*3/uL (ref 0.1–1.0)
Monocytes Relative: 6 % (ref 3–12)
Neutro Abs: 2.8 10*3/uL (ref 1.7–7.7)
Neutrophils Relative %: 37 % — ABNORMAL LOW (ref 43–77)
Platelets: 259 10*3/uL (ref 150–400)
RBC: 3.84 MIL/uL — ABNORMAL LOW (ref 3.87–5.11)
RDW: 14.9 % (ref 11.5–15.5)
WBC: 7.6 10*3/uL (ref 4.0–10.5)

## 2014-10-14 LAB — COMPLETE METABOLIC PANEL WITH GFR
ALT: 9 U/L (ref 0–35)
AST: 12 U/L (ref 0–37)
Albumin: 3.7 g/dL (ref 3.5–5.2)
Alkaline Phosphatase: 58 U/L (ref 39–117)
BUN: 21 mg/dL (ref 6–23)
CO2: 27 mEq/L (ref 19–32)
Calcium: 9.1 mg/dL (ref 8.4–10.5)
Chloride: 105 mEq/L (ref 96–112)
Creat: 1.27 mg/dL — ABNORMAL HIGH (ref 0.50–1.10)
GFR, Est African American: 53 mL/min — ABNORMAL LOW
GFR, Est Non African American: 46 mL/min — ABNORMAL LOW
Glucose, Bld: 94 mg/dL (ref 70–99)
Potassium: 4.1 mEq/L (ref 3.5–5.3)
Sodium: 138 mEq/L (ref 135–145)
Total Bilirubin: 0.2 mg/dL (ref 0.2–1.2)
Total Protein: 7.8 g/dL (ref 6.0–8.3)

## 2014-10-15 ENCOUNTER — Other Ambulatory Visit: Payer: Self-pay | Admitting: Family Medicine

## 2014-10-15 DIAGNOSIS — D649 Anemia, unspecified: Secondary | ICD-10-CM

## 2014-10-15 LAB — SEDIMENTATION RATE: Sed Rate: 20 mm/hr (ref 0–30)

## 2014-10-15 LAB — FERRITIN: Ferritin: 86 ng/mL (ref 10–291)

## 2014-10-15 LAB — IRON: Iron: 42 ug/dL (ref 42–145)

## 2014-11-11 ENCOUNTER — Telehealth: Payer: Self-pay | Admitting: Hematology & Oncology

## 2014-11-11 NOTE — Telephone Encounter (Signed)
CONTACTED REFERRING OFFICE DUE UNABLE TO CONTACT PT IN REFERENCE TO APPT (4 ATTEMPTS TO SCHEDULE)

## 2014-11-25 ENCOUNTER — Ambulatory Visit: Payer: 59

## 2014-11-25 ENCOUNTER — Ambulatory Visit: Payer: 59 | Admitting: Family

## 2014-11-25 ENCOUNTER — Other Ambulatory Visit: Payer: 59

## 2017-05-14 ENCOUNTER — Encounter: Payer: Self-pay | Admitting: Internal Medicine
